# Patient Record
Sex: Male | Born: 1965 | Race: White | Hispanic: No | Marital: Married | Smoking: Former smoker
Health system: Southern US, Community
[De-identification: ages and names within clinical notes are randomized; demographics above are authoritative.]

## PROBLEM LIST (undated history)

## (undated) DIAGNOSIS — N400 Enlarged prostate without lower urinary tract symptoms: Secondary | ICD-10-CM

## (undated) DIAGNOSIS — I10 Essential (primary) hypertension: Secondary | ICD-10-CM

## (undated) DIAGNOSIS — E669 Obesity, unspecified: Secondary | ICD-10-CM

## (undated) HISTORY — PX: VASECTOMY: SHX75

---

## 2000-06-10 ENCOUNTER — Emergency Department (HOSPITAL_COMMUNITY): Admission: EM | Admit: 2000-06-10 | Discharge: 2000-06-10 | Payer: Self-pay | Admitting: Internal Medicine

## 2000-06-10 ENCOUNTER — Encounter: Payer: Self-pay | Admitting: Internal Medicine

## 2014-10-05 ENCOUNTER — Emergency Department (HOSPITAL_COMMUNITY): Payer: 59

## 2014-10-05 ENCOUNTER — Emergency Department (HOSPITAL_COMMUNITY)
Admission: EM | Admit: 2014-10-05 | Discharge: 2014-10-05 | Disposition: A | Discharge status: Home / Self Care | Payer: 59 | Attending: Emergency Medicine | Admitting: Emergency Medicine

## 2014-10-05 ENCOUNTER — Encounter (HOSPITAL_COMMUNITY): Payer: Self-pay | Admitting: *Deleted

## 2014-10-05 DIAGNOSIS — R112 Nausea with vomiting, unspecified: Secondary | ICD-10-CM

## 2014-10-05 DIAGNOSIS — R079 Chest pain, unspecified: Secondary | ICD-10-CM | POA: Diagnosis present

## 2014-10-05 DIAGNOSIS — E669 Obesity, unspecified: Secondary | ICD-10-CM | POA: Diagnosis not present

## 2014-10-05 DIAGNOSIS — R1013 Epigastric pain: Secondary | ICD-10-CM | POA: Diagnosis not present

## 2014-10-05 DIAGNOSIS — R1010 Upper abdominal pain, unspecified: Secondary | ICD-10-CM

## 2014-10-05 DIAGNOSIS — R197 Diarrhea, unspecified: Secondary | ICD-10-CM | POA: Diagnosis not present

## 2014-10-05 DIAGNOSIS — Z880 Allergy status to penicillin: Secondary | ICD-10-CM | POA: Insufficient documentation

## 2014-10-05 DIAGNOSIS — R1011 Right upper quadrant pain: Secondary | ICD-10-CM | POA: Insufficient documentation

## 2014-10-05 HISTORY — DX: Obesity, unspecified: E66.9

## 2014-10-05 LAB — CBC WITH DIFFERENTIAL/PLATELET
Basophils Absolute: 0 10*3/uL (ref 0.0–0.1)
Basophils Relative: 0 % (ref 0–1)
Eosinophils Absolute: 0.1 10*3/uL (ref 0.0–0.7)
Eosinophils Relative: 1 % (ref 0–5)
HCT: 46.7 % (ref 39.0–52.0)
Hemoglobin: 16.1 g/dL (ref 13.0–17.0)
Lymphocytes Relative: 9 % — ABNORMAL LOW (ref 12–46)
Lymphs Abs: 1.1 10*3/uL (ref 0.7–4.0)
MCH: 29.4 pg (ref 26.0–34.0)
MCHC: 34.5 g/dL (ref 30.0–36.0)
MCV: 85.2 fL (ref 78.0–100.0)
Monocytes Absolute: 0.6 10*3/uL (ref 0.1–1.0)
Monocytes Relative: 5 % (ref 3–12)
Neutro Abs: 10.8 10*3/uL — ABNORMAL HIGH (ref 1.7–7.7)
Neutrophils Relative %: 86 % — ABNORMAL HIGH (ref 43–77)
Platelets: 324 10*3/uL (ref 150–400)
RBC: 5.48 MIL/uL (ref 4.22–5.81)
RDW: 12.9 % (ref 11.5–15.5)
WBC: 12.6 10*3/uL — ABNORMAL HIGH (ref 4.0–10.5)

## 2014-10-05 LAB — COMPREHENSIVE METABOLIC PANEL
ALT: 20 U/L (ref 17–63)
AST: 19 U/L (ref 15–41)
Albumin: 3.9 g/dL (ref 3.5–5.0)
Alkaline Phosphatase: 73 U/L (ref 38–126)
Anion gap: 11 (ref 5–15)
BUN: 19 mg/dL (ref 6–20)
CO2: 24 mmol/L (ref 22–32)
Calcium: 8.7 mg/dL — ABNORMAL LOW (ref 8.9–10.3)
Chloride: 103 mmol/L (ref 101–111)
Creatinine, Ser: 1.21 mg/dL (ref 0.61–1.24)
GFR calc Af Amer: >60 mL/min (ref >60)
GFR calc non Af Amer: >60 mL/min (ref >60)
Glucose, Bld: 122 mg/dL — ABNORMAL HIGH (ref 65–99)
Potassium: 4.1 mmol/L (ref 3.5–5.1)
Sodium: 138 mmol/L (ref 135–145)
Total Bilirubin: 0.6 mg/dL (ref 0.3–1.2)
Total Protein: 7.9 g/dL (ref 6.5–8.1)

## 2014-10-05 LAB — I-STAT TROPONIN, ED
TROPONIN I, POC: 0 ng/mL (ref 0.00–0.08)
Troponin i, poc: 0.01 ng/mL (ref 0.00–0.08)

## 2014-10-05 LAB — LIPASE, BLOOD: Lipase: 19 U/L — ABNORMAL LOW (ref 22–51)

## 2014-10-05 MED ORDER — GI COCKTAIL ~~LOC~~
30.0000 mL | Freq: Once | ORAL | Status: AC
  Administered 2014-10-05: 30 mL via ORAL
  Filled 2014-10-05: qty 30

## 2014-10-05 MED ORDER — ONDANSETRON HCL 4 MG/2ML IJ SOLN
4.0000 mg | Freq: Once | INTRAMUSCULAR | Status: AC
  Administered 2014-10-05: 4 mg via INTRAVENOUS
  Filled 2014-10-05: qty 2

## 2014-10-05 MED ORDER — ONDANSETRON HCL 4 MG PO TABS: 4.0000 mg | ORAL_TABLET | Freq: Three times a day (TID) | ORAL | Status: DC | PRN

## 2014-10-05 MED ORDER — MORPHINE SULFATE 4 MG/ML IJ SOLN
4.0000 mg | Freq: Once | INTRAMUSCULAR | Status: AC
  Administered 2014-10-05: 4 mg via INTRAVENOUS
  Filled 2014-10-05: qty 1

## 2014-10-05 MED ORDER — SODIUM CHLORIDE 0.9 % IV BOLUS (SEPSIS)
1000.0000 mL | Freq: Once | INTRAVENOUS | Status: AC
  Administered 2014-10-05: 1000 mL via INTRAVENOUS

## 2014-10-05 NOTE — ED Notes (Signed)
Pt reports feeling like he has heartburn this am, having n/v and diarrhea. Reports intermittent mid chest pains and left arm numbness. Diaphoretic at triage.

## 2014-10-05 NOTE — ED Notes (Signed)
PT ambulated with baseline gait; VSS; A&Ox3; no signs of distress; respirations even and unlabored; skin warm and dry; no questions upon discharge.  

## 2014-10-05 NOTE — Discharge Instructions (Signed)
Please follow-up with your primary care provider for further care. Take Zofran as needed for nausea. Drink fluid is dehydrated. Return to the ED if your condition worsen or if you have any other concern. You will benefit from further evaluation of your heart through a cardiologist.  Please call [facility] to set up an appointment.

## 2014-10-05 NOTE — ED Notes (Signed)
MD at bedside. 

## 2014-10-05 NOTE — ED Provider Notes (Signed)
CSN: [id number]     Arrival date & time 10/05/14  0451 History   First MD Initiated Contact with Patient 10/05/14 0510     Chief Complaint  Patient presents with  . Chest Pain  . Diarrhea     (Consider location/radiation/quality/duration/timing/severity/associated sxs/prior Treatment) HPI  49 year old obese male presenting for evaluation of upper abdominal pain. Patient reportedly developed gradual onset of pain to his epigastric region at 1 AM last night. Pain is crampy, epigastric, associated with nausea, nonbloody nonbilious vomit, and having loose nonbloody non-mucousy stools. Pain subsequently spread to his chest. At one point he was noticing L arm numbness which lasted briefly and resolved without any specific treatment. Arm numbness recurred 5 minutes later and lasting for a few seconds. He denies laying on his arm, no associated headache or neck pain. He subsequently developing sharp pain to his left lateral chest lasting briefly. Pain felt different from heartburn. He reported worsening crampy abdominal pain and became diaphoretic. Subsequently went to the ER for further evaluation. He admits to eating cheeseburger and ice cream yesterday. He denies fever, chills, productive cough, lightheadedness, dizziness, back pain, dysuria, rash. He is a nonsmoker. Does have a strong family history of cardiac disease. No history of diabetes, alcohol abuse, or street drug use. Patient also reported having exertional chest discomfort for the past several months accompanied with shortness of breath. No prior provocative cardiac testing.  Past Medical History  Diagnosis Date  . Obesity    History reviewed. No pertinent past surgical history. History reviewed. No pertinent family history. History  Substance Use Topics  . Smoking status: Not on file  . Smokeless tobacco: Not on file  . Alcohol Use: No    Review of Systems  All other systems reviewed and are negative.     Allergies   Penicillins  Home Medications   Prior to Admission medications   Not on File   BP 124/91 mmHg  Pulse 115  Temp(Src) 98.7 F (37.1 C) (Oral)  Resp 20  Ht 5\' 6"  (1.676 m)  Wt 269 lb 3.2 oz (122.108 kg)  BMI 43.47 kg/m2  SpO2 95% Physical Exam  Constitutional: He is oriented to person, place, and time. He appears well-developed and well-nourished. No distress.  Obese Caucasian male appears to be in no acute discomfort.  HENT:  Head: Atraumatic.  Eyes: Conjunctivae are normal.  Neck: Neck supple.  Cardiovascular: Normal rate, regular rhythm and intact distal pulses.  Exam reveals no gallop and no friction rub.   No murmur heard. Pulmonary/Chest: Effort normal and breath sounds normal. No respiratory distress. He has no wheezes. He has no rales. He exhibits no tenderness.  Abdominal: Soft. Bowel sounds are normal. He exhibits no distension. There is tenderness (Tenderness to epigastric and right upper quadrant on palpation without guarding rebound tenderness. Positive Murphy sign. No pain at McBurney's point.).  Musculoskeletal: He exhibits no edema.  Neurological: He is alert and oriented to person, place, and time.  5/5 strength to BUE.  Intact radial pulses, normal grip strength, sensation intact throughout.   Skin: No rash noted.  Psychiatric: He has a normal mood and affect.  Nursing note and vitals reviewed.   ED Course  Procedures (including critical care time)  Patient here with nausea vomiting diarrhea along with upper abdominal pain. Symptoms concerning for biliary etiology. I will obtain abdominal ultrasound for evaluation. Patient also complaining of chest pain, shortness of breath and also concern of cardiac disease.  His HEART score is 2.  8:53 AM Abdominal ultrasound shows no acute finding to suggest gallbladder etiology. Chest x-ray is unremarkable. EKG initially shows sinus tach however his heart rate has since normalized. No hypoxia.  10:37 AM Patient here  with epigastric abdominal pain and chest pain. He also endorsed intermittent left arm tingling sensation which has since resolved. No lightheadedness or dizziness but did report that he became diaphoretic. His pain is currently controlled after receiving 4 mg of morphine. He has no active chest pain. Mild elevated WBC of 12.6. Chest x-ray without widening mediastinum or acute finding.  Abdominal ultrasound shows no acute finding and no evidence of AAA.  11:54 AM Patient is currently symptom free, able to tolerates by mouth. Dr. Criss Alvine asked to evaluate patient and felt that he is stable for discharge. Return precautions discussed. Cardiology referral given as needed.  Labs Review Labs Reviewed  CBC WITH DIFFERENTIAL/PLATELET - Abnormal; Notable for the following:    WBC 12.6 (*)    Neutrophils Relative % 86 (*)    Neutro Abs 10.8 (*)    Lymphocytes Relative 9 (*)    All other components within normal limits  COMPREHENSIVE METABOLIC PANEL - Abnormal; Notable for the following:    Glucose, Bld 122 (*)    Calcium 8.7 (*)    All other components within normal limits  LIPASE, BLOOD - Abnormal; Notable for the following:    Lipase 19 (*)    All other components within normal limits  I-STAT TROPOININ, ED    Imaging Review US Abdomen Complete  10/05/2014   CLINICAL DATA:  Abdominal pain, nausea, vomiting and chest pain.  EXAM: ULTRASOUND ABDOMEN COMPLETE  COMPARISON:  None.  FINDINGS: Gallbladder: No gallstones or wall thickening visualized. No sonographic Murphy sign noted.  Common bile duct: Diameter: Normal caliber of 3 mm.  Liver: No focal lesion identified. Within normal limits in parenchymal echogenicity.  IVC: No abnormality visualized.  Pancreas: Visualized portion unremarkable.  Spleen: Size and appearance within normal limits.  Right Kidney: Length: 12.4 cm. Echogenicity within normal limits. No mass or hydronephrosis visualized.  Left Kidney: Length: 12.8 cm. Echogenicity within normal  limits. No mass or hydronephrosis visualized.  Abdominal aorta: No aneurysm visualized.  Other findings: None.  IMPRESSION: Normal abdominal ultrasound.   Electronically Signed   By: Irish Lack M.D.   On: 10/05/2014 07:57   Dg Chest Portable 1 View  10/05/2014   CLINICAL DATA:  Chest pain  EXAM: PORTABLE CHEST - 1 VIEW  COMPARISON:  05/30/2014  FINDINGS: A single AP portable view of the chest demonstrates no focal airspace consolidation or alveolar edema. The lungs are grossly clear. There is no large effusion or pneumothorax. Cardiac and mediastinal contours appear unremarkable.  IMPRESSION: No active disease.   Electronically Signed   By: Ellery Plunk M.D.   On: 10/05/2014 06:15     EKG Interpretation   Date/Time:  Wednesday October 05 2014 04:59:49 EDT Ventricular Rate:  113 PR Interval:  136 QRS Duration: 68 QT Interval:  310 QTC Calculation: 425 R Axis:   79 Text Interpretation:  Sinus tachycardia Otherwise normal ECG No prior for  comparison Confirmed by HORTON  MD, COURTNEY ([id number]) on 10/05/2014 5:05:22  AM      MDM   Final diagnoses:  Upper abdominal pain  Nausea vomiting and diarrhea    BP 121/81 mmHg  Pulse 97  Temp(Src) 98.7 F (37.1 C) (Oral)  Resp 16  Ht  (1.676 m)  Wt 269 lb 3.2 oz (  122.108 kg)  BMI 43.47 kg/m2  SpO2 92%  I have reviewed nursing notes and vital signs. I personally reviewed the imaging tests through PACS system  I reviewed available ER/hospitalization records thought the EMR     Fayrene Helper, PA-C 10/05/14 1155  Shon Baton, MD 10/06/14 [phone]

## 2014-10-24 DIAGNOSIS — Z8249 Family history of ischemic heart disease and other diseases of the circulatory system: Secondary | ICD-10-CM | POA: Insufficient documentation

## 2014-10-24 DIAGNOSIS — R079 Chest pain, unspecified: Secondary | ICD-10-CM | POA: Insufficient documentation

## 2014-10-24 DIAGNOSIS — J45909 Unspecified asthma, uncomplicated: Secondary | ICD-10-CM | POA: Insufficient documentation

## 2014-10-24 DIAGNOSIS — R0602 Shortness of breath: Secondary | ICD-10-CM | POA: Insufficient documentation

## 2017-12-17 DIAGNOSIS — M5136 Other intervertebral disc degeneration, lumbar region: Secondary | ICD-10-CM | POA: Insufficient documentation

## 2017-12-17 DIAGNOSIS — M542 Cervicalgia: Secondary | ICD-10-CM | POA: Insufficient documentation

## 2018-02-06 ENCOUNTER — Ambulatory Visit: Payer: 59 | Admitting: Podiatry

## 2018-02-06 ENCOUNTER — Encounter: Payer: Self-pay | Admitting: Podiatry

## 2018-02-06 ENCOUNTER — Ambulatory Visit (INDEPENDENT_AMBULATORY_CARE_PROVIDER_SITE_OTHER): Payer: 59

## 2018-02-06 VITALS — BP 150/84 | HR 87

## 2018-02-06 DIAGNOSIS — S90851A Superficial foreign body, right foot, initial encounter: Secondary | ICD-10-CM | POA: Diagnosis not present

## 2018-02-06 DIAGNOSIS — M795 Residual foreign body in soft tissue: Secondary | ICD-10-CM | POA: Diagnosis not present

## 2018-02-06 DIAGNOSIS — L089 Local infection of the skin and subcutaneous tissue, unspecified: Secondary | ICD-10-CM

## 2018-02-06 DIAGNOSIS — M79671 Pain in right foot: Secondary | ICD-10-CM

## 2018-02-06 MED ORDER — HYDROCODONE-ACETAMINOPHEN 5-325 MG PO TABS: 1.0000 | ORAL_TABLET | Freq: Four times a day (QID) | ORAL | 0 refills | Status: DC | PRN

## 2018-02-06 MED ORDER — CIPROFLOXACIN HCL 250 MG PO TABS: 250.0000 mg | ORAL_TABLET | Freq: Two times a day (BID) | ORAL | 0 refills | Status: AC

## 2018-02-06 MED ORDER — KETOCONAZOLE 2 % EX CREA: TOPICAL_CREAM | CUTANEOUS | 0 refills | Status: AC

## 2018-02-09 ENCOUNTER — Encounter: Payer: Self-pay | Admitting: Podiatry

## 2018-02-09 ENCOUNTER — Ambulatory Visit (INDEPENDENT_AMBULATORY_CARE_PROVIDER_SITE_OTHER): Payer: 59 | Admitting: Podiatry

## 2018-02-09 VITALS — Temp 98.4°F

## 2018-02-09 DIAGNOSIS — L089 Local infection of the skin and subcutaneous tissue, unspecified: Secondary | ICD-10-CM

## 2018-02-09 DIAGNOSIS — M79604 Pain in right leg: Secondary | ICD-10-CM

## 2018-02-09 DIAGNOSIS — L03119 Cellulitis of unspecified part of limb: Secondary | ICD-10-CM | POA: Diagnosis not present

## 2018-02-09 DIAGNOSIS — S90851A Superficial foreign body, right foot, initial encounter: Secondary | ICD-10-CM

## 2018-02-09 DIAGNOSIS — L02619 Cutaneous abscess of unspecified foot: Secondary | ICD-10-CM

## 2018-02-09 MED ORDER — CIPROFLOXACIN HCL 500 MG PO TABS: 500.0000 mg | ORAL_TABLET | Freq: Two times a day (BID) | ORAL | 0 refills | Status: AC

## 2018-02-09 MED ORDER — OXYCODONE-ACETAMINOPHEN 10-325 MG PO TABS: 1.0000 | ORAL_TABLET | ORAL | 0 refills | Status: AC | PRN

## 2018-02-09 MED ORDER — CLINDAMYCIN HCL 300 MG PO CAPS: 300.0000 mg | ORAL_CAPSULE | Freq: Three times a day (TID) | ORAL | 1 refills | Status: AC

## 2018-02-10 NOTE — Progress Notes (Signed)
Subjective:   Patient ID: Curtis Lamb, male   DOB: 52 y.o.   MRN: [id number]   HPI patient presents stating that 3 days early.  Had a small fishbone removed in the bottom of his right foot by Dr. Samuella Cota and that it started to bother him more over the last day and is having trouble bearing weight down on his foot.  Patient has not had any other issues and thought maybe he had a low-grade fever the night before but not sure and today when his temperature was checked it was 98.4   ROS      Objective:  Physical Exam  Neurovascular status intact with patient found to have irritation around the plantar lateral aspect of the right foot where a previous small incision was made.  It is localized and I did not note any proximal edema erythema or no active drainage at the current time with mild soreness noted.  Patient had negative Homans sign and no systemic indications of infection     Assessment:  Possibility that this is an inflammatory reaction versus possibility for localized infective reaction even though there is no current drainage or indication of abscess     Plan:  H&P condition reviewed at great length and as precautionary measure I did place the patient on Cleocin and up to the Cipro dosage to 500 mg twice daily.  I instructed on soaks and I dispensed an air fracture walker to try to reduce the stress on his foot and I gave strict instructions of any redness should occur any systemic signs of infection or any active drainage were to occur he is to go straight to the emergency room.  I did discuss that this may need to be opened up and that it is possible it will require hospitalization and I am going to have him see Dr. Samuella Cota on Thursday and hope that he will respond to the antibiotic by then but he is given strict instructions to call us if any changes were to occur.

## 2018-02-12 ENCOUNTER — Encounter: Payer: Self-pay | Admitting: Podiatry

## 2018-02-12 ENCOUNTER — Ambulatory Visit: Payer: Self-pay | Admitting: Podiatry

## 2018-02-12 ENCOUNTER — Telehealth: Payer: Self-pay | Admitting: *Deleted

## 2018-02-12 DIAGNOSIS — M79604 Pain in right leg: Secondary | ICD-10-CM

## 2018-02-12 DIAGNOSIS — M79671 Pain in right foot: Secondary | ICD-10-CM

## 2018-02-12 DIAGNOSIS — L03119 Cellulitis of unspecified part of limb: Secondary | ICD-10-CM

## 2018-02-12 DIAGNOSIS — L02619 Cutaneous abscess of unspecified foot: Secondary | ICD-10-CM

## 2018-02-12 DIAGNOSIS — S90851D Superficial foreign body, right foot, subsequent encounter: Secondary | ICD-10-CM

## 2018-02-12 DIAGNOSIS — L089 Local infection of the skin and subcutaneous tissue, unspecified: Secondary | ICD-10-CM

## 2018-02-12 DIAGNOSIS — S90851A Superficial foreign body, right foot, initial encounter: Principal | ICD-10-CM

## 2018-02-12 NOTE — Telephone Encounter (Signed)
Dr. Samuella Cota ordered US right foot STAT for FB with Abscess. Leeanne Deed Main Scheduling-Radiology scheduled pt for 02/13/2018 arrive 9:15am for 9:30am [place] at [facility]. Faxed orders to [facility] Scheduling.

## 2018-02-12 NOTE — Telephone Encounter (Signed)
I informed pt of the [place] Appt tomorrow at [facility].

## 2018-02-13 ENCOUNTER — Ambulatory Visit (HOSPITAL_COMMUNITY)
Admission: RE | Admit: 2018-02-13 | Discharge: 2018-02-13 | Disposition: A | Discharge status: Home / Self Care | Payer: 59 | Source: Ambulatory Visit | Attending: Podiatry | Admitting: Podiatry

## 2018-02-13 ENCOUNTER — Telehealth: Payer: Self-pay | Admitting: Podiatry

## 2018-02-13 DIAGNOSIS — L02619 Cutaneous abscess of unspecified foot: Secondary | ICD-10-CM | POA: Diagnosis present

## 2018-02-13 DIAGNOSIS — S90851A Superficial foreign body, right foot, initial encounter: Secondary | ICD-10-CM | POA: Insufficient documentation

## 2018-02-13 DIAGNOSIS — L03119 Cellulitis of unspecified part of limb: Secondary | ICD-10-CM | POA: Diagnosis present

## 2018-02-13 DIAGNOSIS — M79604 Pain in right leg: Secondary | ICD-10-CM | POA: Insufficient documentation

## 2018-02-13 DIAGNOSIS — X58XXXA Exposure to other specified factors, initial encounter: Secondary | ICD-10-CM | POA: Diagnosis not present

## 2018-02-13 DIAGNOSIS — L089 Local infection of the skin and subcutaneous tissue, unspecified: Secondary | ICD-10-CM | POA: Diagnosis not present

## 2018-02-13 NOTE — Telephone Encounter (Signed)
Attempted to call patient to discuss results. No answer. VM left. Advised patient to call back and we will discuss results.

## 2018-02-16 ENCOUNTER — Telehealth: Payer: Self-pay | Admitting: *Deleted

## 2018-02-16 ENCOUNTER — Telehealth: Payer: Self-pay | Admitting: Podiatry

## 2018-02-16 NOTE — Telephone Encounter (Signed)
Pt. Returned call to discuss results

## 2018-02-16 NOTE — Telephone Encounter (Signed)
Dr. Samuella Cota reviewed the emailed photo.From: [name]> Sent: Monday, February 16, 2018 2:22 PM To: Alphia [email or web address]> >

## 2018-02-16 NOTE — Telephone Encounter (Signed)
Dr. Samuella Cota states the foot doesn't look bad, keep clean, apply antibiotic ointment and a bandaid. I called orders to pt and he states that is what he is doing, and asked for his appt time 02/20/2018. I told pt the appt Friday was at 3:15pm.

## 2018-02-16 NOTE — Telephone Encounter (Signed)
Dr. Samuella Cota spoke with pt and explained [place] results and treatment options. Pt states he would like to send a picture of the foot current state. I received the emailed photo of pt's foot and forwarded to Dr. Samuella Cota.

## 2018-02-17 NOTE — Telephone Encounter (Signed)
Called and discusses results with patient yesterday. Will f/u in the office this week.

## 2018-02-20 ENCOUNTER — Ambulatory Visit: Payer: 59 | Admitting: Podiatry

## 2018-02-20 ENCOUNTER — Encounter: Payer: Self-pay | Admitting: Podiatry

## 2018-02-20 DIAGNOSIS — M79604 Pain in right leg: Secondary | ICD-10-CM

## 2018-02-20 DIAGNOSIS — S90851D Superficial foreign body, right foot, subsequent encounter: Secondary | ICD-10-CM | POA: Diagnosis not present

## 2018-02-20 DIAGNOSIS — L03119 Cellulitis of unspecified part of limb: Secondary | ICD-10-CM | POA: Diagnosis not present

## 2018-02-20 DIAGNOSIS — L089 Local infection of the skin and subcutaneous tissue, unspecified: Secondary | ICD-10-CM

## 2018-02-20 DIAGNOSIS — M795 Residual foreign body in soft tissue: Secondary | ICD-10-CM | POA: Diagnosis not present

## 2018-02-20 DIAGNOSIS — L02619 Cutaneous abscess of unspecified foot: Secondary | ICD-10-CM

## 2018-02-20 MED ORDER — HYDROCODONE-ACETAMINOPHEN 5-325 MG PO TABS: 1.0000 | ORAL_TABLET | Freq: Four times a day (QID) | ORAL | 0 refills | Status: DC | PRN

## 2018-02-23 ENCOUNTER — Telehealth: Payer: Self-pay | Admitting: *Deleted

## 2018-02-23 NOTE — Telephone Encounter (Signed)
Pt presents to office states he needs a note explaining the light duty. I told pt did not have that in the last Clinicals, but would write Dr. Samuella Cota and have the note ready as soon as possible tomorrow. Pt states he has been out of work 2 weeks and needs to get back. 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

## 2018-02-24 ENCOUNTER — Encounter: Payer: Self-pay | Admitting: *Deleted

## 2018-02-24 NOTE — Progress Notes (Signed)
  Subjective:  Patient ID: Curtis Lamb, male    DOB: 06-08-1965,  MRN: [id number]  Chief Complaint  Patient presents with  . Wound Check    Follow up on splinter in right foot, lateral side. Pt states healing well with no new complaints. Some redness at wound.   52 y.o. male presents with the above complaint. Thinks that some piece of glass came out of the wound over the weekend. Finally starting to feel better.  Review of Systems: Negative except as noted in the HPI. Denies N/V/F/Ch.  Past Medical History:  Diagnosis Date  . Obesity     Current Outpatient Medications:  .  ciprofloxacin (CIPRO) 250 MG tablet, Take 1 tablet (250 mg total) by mouth 2 (two) times daily., Disp: 6 tablet, Rfl: 0 .  ciprofloxacin (CIPRO) 500 MG tablet, Take 1 tablet (500 mg total) by mouth 2 (two) times daily., Disp: 20 tablet, Rfl: 0 .  clindamycin (CLEOCIN) 300 MG capsule, Take 1 capsule (300 mg total) by mouth 3 (three) times daily., Disp: 30 capsule, Rfl: 1 .  HYDROcodone-acetaminophen (NORCO) 5-325 MG tablet, Take 1 tablet by mouth every 6 (six) hours as needed for moderate pain., Disp: 12 tablet, Rfl: 0 .  ketoconazole (NIZORAL) 2 % cream, Apply 1 fingertip amount to each foot daily., Disp: 30 g, Rfl: 0 .  ondansetron (ZOFRAN) 4 MG tablet, Take 1 tablet (4 mg total) by mouth every 8 (eight) hours as needed for nausea or vomiting., Disp: 12 tablet, Rfl: 0 .  oxyCODONE-acetaminophen (PERCOCET) 10-325 MG tablet, Take 1 tablet by mouth every 4 (four) hours as needed for pain., Disp: 30 tablet, Rfl: 0  Social History   Tobacco Use  Smoking Status Never Smoker  Smokeless Tobacco Never Used    Allergies  Allergen Reactions  . Penicillins    Objective:   There were no vitals filed for this visit. There is no height or weight on file to calculate BMI. Constitutional Well developed. Well nourished.  Vascular Dorsalis pedis pulses palpable bilaterally. Posterior tibial pulses palpable  bilaterally. Capillary refill normal to all digits.  No cyanosis or clubbing noted. Pedal hair growth normal.  Neurologic Normal speech. Oriented to person, place, and time. Epicritic sensation to light touch grossly present bilaterally.  Dermatologic Nails well groomed and normal in appearance. No open wounds.  Puncture wound present of the right foot healing well with only slight periwound erythema.  Orthopedic: Normal joint ROM without pain or crepitus bilaterally. No visible deformities. No bony tenderness.   Radiographs: none today Assessment:   1. Foreign body in right foot with infection, subsequent encounter   2. Cellulitis and abscess of foot, except toes   3. Pain of right lower extremity   4. Residual foreign body in soft tissue    Plan:  Patient was evaluated and treated and all questions answered.  Splinter Right Foot -[place] reviewed no evidence of retained foreign body. Small fluid collection. -Transition out of boot at patient's comfort. -Should pain persist would consider surgical I&D for small residual fluid collection.  -Ok to go back to work next week  Return in about 2 weeks (around 03/06/2018) for foreign body f/u.

## 2018-02-24 NOTE — Telephone Encounter (Signed)
I informed pt of Dr. Kandice Hams orders for light duty as no standing more than 4 hours per day.

## 2018-02-24 NOTE — Progress Notes (Signed)
Subjective:  Patient ID: Curtis Lamb, male    DOB: [birth year],  MRN: [id number]  Chief Complaint  Patient presents with  . Foot Pain    (NP) splinter in right foot/oked per Dr. Samuella Cota    52 y.o. male presents with the above complaint.  Reports a splinter to the right foot.  Has had pain for about a week.  States there is very painful to walk on.   Review of Systems: Negative except as noted in the HPI. Denies N/V/F/Ch.  Past Medical History:  Diagnosis Date  . Obesity     Current Outpatient Medications:  .  ciprofloxacin (CIPRO) 250 MG tablet, Take 1 tablet (250 mg total) by mouth 2 (two) times daily., Disp: 6 tablet, Rfl: 0 .  ciprofloxacin (CIPRO) 500 MG tablet, Take 1 tablet (500 mg total) by mouth 2 (two) times daily., Disp: 20 tablet, Rfl: 0 .  clindamycin (CLEOCIN) 300 MG capsule, Take 1 capsule (300 mg total) by mouth 3 (three) times daily., Disp: 30 capsule, Rfl: 1 .  HYDROcodone-acetaminophen (NORCO) 5-325 MG tablet, Take 1 tablet by mouth every 6 (six) hours as needed for moderate pain., Disp: 12 tablet, Rfl: 0 .  ketoconazole (NIZORAL) 2 % cream, Apply 1 fingertip amount to each foot daily., Disp: 30 g, Rfl: 0 .  ondansetron (ZOFRAN) 4 MG tablet, Take 1 tablet (4 mg total) by mouth every 8 (eight) hours as needed for nausea or vomiting., Disp: 12 tablet, Rfl: 0 .  oxyCODONE-acetaminophen (PERCOCET) 10-325 MG tablet, Take 1 tablet by mouth every 4 (four) hours as needed for pain., Disp: 30 tablet, Rfl: 0  Social History   Tobacco Use  Smoking Status Never Smoker  Smokeless Tobacco Never Used    Allergies  Allergen Reactions  . Penicillins    Objective:   Vitals:   02/06/18 1329  BP: (!) 150/84  Pulse: 87   There is no height or weight on file to calculate BMI. Constitutional Well developed. Well nourished.  Vascular Dorsalis pedis pulses palpable bilaterally. Posterior tibial pulses palpable bilaterally. Capillary refill normal to all digits.  No  cyanosis or clubbing noted. Pedal hair growth normal.  Neurologic Normal speech. Oriented to person, place, and time. Epicritic sensation to light touch grossly present bilaterally.  Dermatologic Nails well groomed and normal in appearance. No open wounds.  Puncture wound present of the right foot with pain palpation about the wound  Orthopedic: Normal joint ROM without pain or crepitus bilaterally. No visible deformities. No bony tenderness.   Radiographs: Taken and reviewed no underlying foreign body evident on x-ray Assessment:   1. Foreign body in right foot with infection, initial encounter   2. Pain in right foot   3. Residual foreign body in soft tissue    Plan:  Patient was evaluated and treated and all questions answered.  Splinter Right Foot -X-rays taken and reviewed as above -Foreign body removed as below -CAM boot dispensed -Follow-up in 2 weeks for recheck -Advised to follow-up with PCP for possible tetanus covered -Rx Cipro for antibiotic prophylaxis and Norco for pain.  Procedure: Removal of foreign body Anesthesia: Lidocaine with epi 2 ml Instrumentation: 312 blade Technique: Following sterile skin prep, the area was debrided with a 312 blade. A shard of what appears to be glass was removed from the wound. Dressing: Dry, sterile, compression dressing. Disposition: Patient tolerated procedure well. Patient to return in 1 week for follow-up.   Return in about 2 weeks (around 02/20/2018) for splinter f/u.

## 2018-02-24 NOTE — Progress Notes (Signed)
  Subjective:  Patient ID: Curtis Lamb, male    DOB: 06/10/1965,  MRN: [id number]  Chief Complaint  Patient presents with  . Foot Injury    right foot bottom lateral; pt stated, "pain is extreme today, feels like a blister but no fluid coming out"   52 y.o. male presents with the above complaint. Still having a lot of pain.  Review of Systems: Negative except as noted in the HPI. Denies N/V/F/Ch.  Past Medical History:  Diagnosis Date  . Obesity     Current Outpatient Medications:  .  ciprofloxacin (CIPRO) 250 MG tablet, Take 1 tablet (250 mg total) by mouth 2 (two) times daily., Disp: 6 tablet, Rfl: 0 .  ciprofloxacin (CIPRO) 500 MG tablet, Take 1 tablet (500 mg total) by mouth 2 (two) times daily., Disp: 20 tablet, Rfl: 0 .  clindamycin (CLEOCIN) 300 MG capsule, Take 1 capsule (300 mg total) by mouth 3 (three) times daily., Disp: 30 capsule, Rfl: 1 .  ketoconazole (NIZORAL) 2 % cream, Apply 1 fingertip amount to each foot daily., Disp: 30 g, Rfl: 0 .  ondansetron (ZOFRAN) 4 MG tablet, Take 1 tablet (4 mg total) by mouth every 8 (eight) hours as needed for nausea or vomiting., Disp: 12 tablet, Rfl: 0 .  oxyCODONE-acetaminophen (PERCOCET) 10-325 MG tablet, Take 1 tablet by mouth every 4 (four) hours as needed for pain., Disp: 30 tablet, Rfl: 0 .  HYDROcodone-acetaminophen (NORCO) 5-325 MG tablet, Take 1 tablet by mouth every 6 (six) hours as needed for moderate pain., Disp: 12 tablet, Rfl: 0  Social History   Tobacco Use  Smoking Status Never Smoker  Smokeless Tobacco Never Used    Allergies  Allergen Reactions  . Penicillins    Objective:   There were no vitals filed for this visit. There is no height or weight on file to calculate BMI. Constitutional Well developed. Well nourished.  Vascular Dorsalis pedis pulses palpable bilaterally. Posterior tibial pulses palpable bilaterally. Capillary refill normal to all digits.  No cyanosis or clubbing noted. Pedal hair  growth normal.  Neurologic Normal speech. Oriented to person, place, and time. Epicritic sensation to light touch grossly present bilaterally.  Dermatologic Nails well groomed and normal in appearance. No open wounds.  Puncture wound present of the right foot with pain palpation about the wound   Orthopedic: Normal joint ROM without pain or crepitus bilaterally. No visible deformities. No bony tenderness.   Radiographs: none today Assessment:   1. Foreign body in right foot with infection, subsequent encounter   2. Cellulitis and abscess of foot, except toes   3. Pain of right lower extremity   4. Pain in right foot    Plan:  Patient was evaluated and treated and all questions answered.  Splinter Right Foot -Healing with residual pain.  -No evident foreign body. -WBAT in CAM boot.. -Order [place] to r/o residual soft tissue foreign body.  Return in about 1 week (around 02/19/2018) for foreign body f/u.

## 2018-02-24 NOTE — Telephone Encounter (Signed)
DoneSherlynn Stalls duty no standing more than 4 hours.

## 2018-03-10 ENCOUNTER — Encounter: Payer: Self-pay | Admitting: Podiatry

## 2018-03-10 NOTE — Progress Notes (Signed)
Medical records were mailed to patient.

## 2018-07-03 DIAGNOSIS — E538 Deficiency of other specified B group vitamins: Secondary | ICD-10-CM | POA: Insufficient documentation

## 2018-07-03 DIAGNOSIS — E559 Vitamin D deficiency, unspecified: Secondary | ICD-10-CM | POA: Insufficient documentation

## 2018-07-27 ENCOUNTER — Emergency Department
Admission: EM | Admit: 2018-07-27 | Discharge: 2018-07-27 | Disposition: A | Discharge status: Home / Self Care | Payer: BLUE CROSS/BLUE SHIELD | Attending: Emergency Medicine | Admitting: Emergency Medicine

## 2018-07-27 ENCOUNTER — Other Ambulatory Visit: Payer: Self-pay

## 2018-07-27 ENCOUNTER — Encounter: Payer: Self-pay | Admitting: Emergency Medicine

## 2018-07-27 ENCOUNTER — Emergency Department: Payer: BLUE CROSS/BLUE SHIELD

## 2018-07-27 DIAGNOSIS — R509 Fever, unspecified: Secondary | ICD-10-CM

## 2018-07-27 DIAGNOSIS — Z87891 Personal history of nicotine dependence: Secondary | ICD-10-CM | POA: Diagnosis not present

## 2018-07-27 DIAGNOSIS — B9729 Other coronavirus as the cause of diseases classified elsewhere: Secondary | ICD-10-CM | POA: Insufficient documentation

## 2018-07-27 DIAGNOSIS — I10 Essential (primary) hypertension: Secondary | ICD-10-CM | POA: Diagnosis not present

## 2018-07-27 HISTORY — DX: Essential (primary) hypertension: I10

## 2018-07-27 HISTORY — DX: Benign prostatic hyperplasia without lower urinary tract symptoms: N40.0

## 2018-07-27 LAB — BASIC METABOLIC PANEL
Anion gap: 10 (ref 5–15)
BUN: 18 mg/dL (ref 6–20)
CO2: 24 mmol/L (ref 22–32)
Calcium: 8.6 mg/dL — ABNORMAL LOW (ref 8.9–10.3)
Chloride: 99 mmol/L (ref 98–111)
Creatinine, Ser: 1.31 mg/dL — ABNORMAL HIGH (ref 0.61–1.24)
GFR calc Af Amer: >60 mL/min (ref >60)
GFR calc non Af Amer: >60 mL/min (ref >60)
GLUCOSE: 121 mg/dL — AB (ref 70–99)
Potassium: 4 mmol/L (ref 3.5–5.1)
Sodium: 133 mmol/L — ABNORMAL LOW (ref 135–145)

## 2018-07-27 LAB — CBC
HCT: 43.4 % (ref 39.0–52.0)
Hemoglobin: 14.6 g/dL (ref 13.0–17.0)
MCH: 28.7 pg (ref 26.0–34.0)
MCHC: 33.6 g/dL (ref 30.0–36.0)
MCV: 85.3 fL (ref 80.0–100.0)
Platelets: 136 10*3/uL — ABNORMAL LOW (ref 150–400)
RBC: 5.09 MIL/uL (ref 4.22–5.81)
RDW: 13 % (ref 11.5–15.5)
WBC: 4.9 10*3/uL (ref 4.0–10.5)
nRBC: 0 % (ref 0.0–0.2)

## 2018-07-27 NOTE — Discharge Instructions (Addendum)
Return to the ER for new, worsening, or persistent severe shortness of breath, high fevers, weakness, or any other new or worsening symptoms that concern you.       Person Under Monitoring Name: Kenneth Briggs  Location: [address] [place] [address]   Infection Prevention Recommendations for Individuals Confirmed to have, or Being Evaluated for, 2019 Novel Coronavirus (COVID-19) Infection Who Receive Care at Home  Individuals who are confirmed to have, or are being evaluated for, COVID-19 should follow the prevention steps below until a healthcare provider or local or state health department says they can return to normal activities.  Stay home except to get medical care You should restrict activities outside your home, except for getting medical care. Do not go to work, school, or public areas, and do not use public transportation or taxis.  Call ahead before visiting your doctor Before your medical appointment, call the healthcare provider and tell them that you have, or are being evaluated for, COVID-19 infection. This will help the healthcare providers office take steps to keep other people from getting infected. Ask your healthcare provider to call the local or state health department.  Monitor your symptoms Seek prompt medical attention if your illness is worsening (e.g., difficulty breathing). Before going to your medical appointment, call the healthcare provider and tell them that you have, or are being evaluated for, COVID-19 infection. Ask your healthcare provider to call the local or state health department.  Wear a facemask You should wear a facemask that covers your nose and mouth when you are in the same room with other people and when you visit a healthcare provider. People who live with or visit you should also wear a facemask while they are in the same room with you.  Separate yourself from other people in your home As much as possible, you should stay  in a different room from other people in your home. Also, you should use a separate bathroom, if available.  Avoid sharing household items You should not share dishes, drinking glasses, cups, eating utensils, towels, bedding, or other items with other people in your home. After using these items, you should wash them thoroughly with soap and water.  Cover your coughs and sneezes Cover your mouth and nose with a tissue when you cough or sneeze, or you can cough or sneeze into your sleeve. Throw used tissues in a lined trash can, and immediately wash your hands with soap and water for at least 20 seconds or use an alcohol-based hand rub.  Wash your [employer] your hands often and thoroughly with soap and water for at least 20 seconds. You can use an alcohol-based hand sanitizer if soap and water are not available and if your hands are not visibly dirty. Avoid touching your eyes, nose, and mouth with unwashed hands.   Prevention Steps for Caregivers and Household Members of Individuals Confirmed to have, or Being Evaluated for, COVID-19 Infection Being Cared for in the Home  If you live with, or provide care at home for, a person confirmed to have, or being evaluated for, COVID-19 infection please follow these guidelines to prevent infection:  Follow healthcare providers instructions Make sure that you understand and can help the patient follow any healthcare provider instructions for all care.  Provide for the patients basic needs You should help the patient with basic needs in the home and provide support for getting groceries, prescriptions, and other personal needs.  Monitor the patients symptoms If they are  getting sicker, call his or her medical provider and tell them that the patient has, or is being evaluated for, COVID-19 infection. This will help the healthcare providers office take steps to keep other people from getting infected. Ask the healthcare provider to call the  local or state health department.  Limit the number of people who have contact with the patient If possible, have only one caregiver for the patient. Other household members should stay in another home or place of residence. If this is not possible, they should stay in another room, or be separated from the patient as much as possible. Use a separate bathroom, if available. Restrict visitors who do not have an essential need to be in the home.  Keep older adults, very young children, and other sick people away from the patient Keep older adults, very young children, and those who have compromised immune systems or chronic health conditions away from the patient. This includes people with chronic heart, lung, or kidney conditions, diabetes, and cancer.  Ensure good ventilation Make sure that shared spaces in the home have good air flow, such as from an air conditioner or an opened window, weather permitting.  Wash your hands often Wash your hands often and thoroughly with soap and water for at least 20 seconds. You can use an alcohol based hand sanitizer if soap and water are not available and if your hands are not visibly dirty. Avoid touching your eyes, nose, and mouth with unwashed hands. Use disposable paper towels to dry your hands. If not available, use dedicated cloth towels and replace them when they become wet.  Wear a facemask and gloves Wear a disposable facemask at all times in the room and gloves when you touch or have contact with the patients blood, body fluids, and/or secretions or excretions, such as sweat, saliva, sputum, nasal mucus, vomit, urine, or feces.  Ensure the mask fits over your nose and mouth tightly, and do not touch it during use. Throw out disposable facemasks and gloves after using them. Do not reuse. Wash your hands immediately after removing your facemask and gloves. If your personal clothing becomes contaminated, carefully remove clothing and launder. Wash  your hands after handling contaminated clothing. Place all used disposable facemasks, gloves, and other waste in a lined container before disposing them with other household waste. Remove gloves and wash your hands immediately after handling these items.  Do not share dishes, glasses, or other household items with the patient Avoid sharing household items. You should not share dishes, drinking glasses, cups, eating utensils, towels, bedding, or other items with a patient who is confirmed to have, or being evaluated for, COVID-19 infection. After the person uses these items, you should wash them thoroughly with soap and water.  Wash laundry thoroughly Immediately remove and wash clothes or bedding that have blood, body fluids, and/or secretions or excretions, such as sweat, saliva, sputum, nasal mucus, vomit, urine, or feces, on them. Wear gloves when handling laundry from the patient. Read and follow directions on labels of laundry or clothing items and detergent. In general, wash and dry with the warmest temperatures recommended on the label.  Clean all areas the individual has used often Clean all touchable surfaces, such as counters, tabletops, doorknobs, bathroom fixtures, toilets, phones, keyboards, tablets, and bedside tables, every day. Also, clean any surfaces that may have blood, body fluids, and/or secretions or excretions on them. Wear gloves when cleaning surfaces the patient has come in contact with. Use a diluted  bleach solution (e.g., dilute bleach with 1 part bleach and 10 parts water) or a household disinfectant with a label that says EPA-registered for coronaviruses. To make a bleach solution at home, add 1 tablespoon of bleach to 1 quart (4 cups) of water. For a larger supply, add  cup of bleach to 1 gallon (16 cups) of water. Read labels of cleaning products and follow recommendations provided on product labels. Labels contain instructions for safe and effective use of the  cleaning product including precautions you should take when applying the product, such as wearing gloves or eye protection and making sure you have good ventilation during use of the product. Remove gloves and wash hands immediately after cleaning.  Monitor yourself for signs and symptoms of illness Caregivers and household members are considered close contacts, should monitor their health, and will be asked to limit movement outside of the home to the extent possible. Follow the monitoring steps for close contacts listed on the symptom monitoring form.   ? If you have additional questions, contact your local health department or call the epidemiologist on call at [phone] (available 24/7). ? This guidance is subject to change. For the most up-to-date guidance from [facility], please refer to their website: [email or web address]

## 2018-07-27 NOTE — ED Triage Notes (Signed)
Pt states last Friday he was diagnosed with a "bad head cold," at via telehealth MD, cough worsened yesterday and was [facility] which was [facility], he called COVID hotline this am and they recommended he come to ED since his shob and fever increasing. They recommended he get CXR and COVID tested. Fever 103.8 this am. Took tylenol. NAD at this time.

## 2018-07-27 NOTE — ED Provider Notes (Signed)
[facility] Emergency Department Provider Note ____________________________________________   First MD Initiated Contact with Patient 07/27/18 1109     (approximate)  I have reviewed the triage vital signs and the nursing notes.   HISTORY  Chief Complaint Cough; Back Pain; Shortness of Breath; and Fever    HPI Kenneth Briggs is a 53 y.o. male with PMH as noted below who presents with fever, cough, and shortness of breath for the last 3 days, gradual onset, with fever as high as 103 at home and only intermittently relieved with Tylenol and ibuprofen.  The patient was tested for influenza at urgent care and was negative.  He called the COVID-19 hotline and was told to come into the emergency department to be tested.  Past Medical History:  Diagnosis Date  . Enlarged prostate   . Hypertension     There are no active problems to display for this patient.   Past Surgical History:  Procedure Laterality Date  . VASECTOMY      Prior to Admission medications   Not on File    Allergies Lisinopril  No family history on file.  Social History Social History   Tobacco Use  . Smoking status: Former Games developer  . Smokeless tobacco: Never Used  Substance Use Topics  . Alcohol use: Not Currently  . Drug use: Not on file    Review of Systems  Constitutional: Positive for fever Eyes: No redness. ENT: No sore throat. Cardiovascular: Denies chest pain. Respiratory: Positive for shortness of breath. Gastrointestinal: No vomiting.  Positive for diarrhea.  Genitourinary: Negative for flank pain.  Musculoskeletal: Negative for back pain. Skin: Negative for rash. Neurological: Negative for headache.   ____________________________________________   PHYSICAL EXAM:  VITAL SIGNS: ED Triage Vitals  Enc Vitals Group     BP 07/27/18 1033 105/74     Pulse Rate 07/27/18 1033 (!) 106     Resp 07/27/18 1033 18     Temp 07/27/18 1033 99 F (37.2 C)   Temp Source 07/27/18 1033 Oral     SpO2 07/27/18 1033 100 %     Weight 07/27/18 1035 280 lb (127 kg)     Height 07/27/18 1035 5\' 10"  (1.778 m)     Head Circumference --      Peak Flow --      Pain Score --      Pain Loc --      Pain Edu? --      Excl. in GC? --     Constitutional: Alert and oriented.  Relatively well appearing and in no acute distress. Eyes: Conjunctivae are normal.  Head: Atraumatic. Nose: No congestion/rhinnorhea. Mouth/Throat: Mucous membranes are moist.   Neck: Normal range of motion.  Cardiovascular: Normal rate, regular rhythm. Grossly normal heart sounds.  Good peripheral circulation. Respiratory: Normal respiratory effort.  No retractions. Lungs CTAB. Gastrointestinal:  No distention.  Musculoskeletal: No lower extremity edema.  Extremities warm and well perfused.  Neurologic:  Normal speech and language. No gross focal neurologic deficits are appreciated.  Skin:  Skin is warm and dry. No rash noted. Psychiatric: Mood and affect are normal. Speech and behavior are normal.  ____________________________________________   LABS (all labs ordered are listed, but only abnormal results are displayed)  Labs Reviewed  CBC - Abnormal; Notable for the following components:      Result Value   Platelets 136 (*)    All other components within normal limits  BASIC METABOLIC PANEL - Abnormal; Notable for  the following components:   Sodium 133 (*)    Glucose, Bld 121 (*)    Creatinine, Ser 1.31 (*)    Calcium 8.6 (*)    All other components within normal limits  NOVEL CORONAVIRUS, NAA (HOSPITAL ORDER, SEND-OUT TO REF LAB)   ____________________________________________  EKG   ____________________________________________  RADIOLOGY  CXR: Right basilar atelectasis with no focal infiltrate  ____________________________________________   PROCEDURES  Procedure(s) performed: No  Procedures  Critical Care performed: No  ____________________________________________   INITIAL IMPRESSION / ASSESSMENT AND PLAN / ED COURSE  Pertinent labs & imaging results that were available during my care of the patient were reviewed by me and considered in my medical decision making (see chart for details).  53 year old male with a history of hypertension presents with fever, shortness of breath, and some cough over the last several days.  The patient was tested for influenza and it was negative.  He has been on a Z-Pak without improvement.  He called the COVID-19 hotline and was instructed to come to the emergency department for testing.  He states that 2 of his coworkers are shared equipment with him travel to areas with outbreaks and subsequently were out of work, one to [place] and one to [place].  On exam, the patient is overall well-appearing.  He has a low-grade temperature and borderline tachycardia.  His O2 saturation is 100% on room air and he has no respiratory distress or increased work of breathing.  His lungs are clear on exam.  Labs and chest x-ray obtained from triage are reassuring.  Overall presentation is consistent with viral bronchitis although given the nature of the symptoms and the patient's history, COVID-19 is certainly possible.  We will send a test today.  At this time given his work-up and respiratory status, the patient does not require admission to the hospital.  I gave him extensive return precautions, and we have provided him both verbal and written quarantine instructions.  _____  Kenneth Briggs was evaluated in Emergency Department on 07/27/2018 for the symptoms described in the history of present illness. He was evaluated in the context of the global COVID-19 pandemic, which necessitated consideration that the patient might be at risk for infection with the SARS-CoV-2 virus that causes COVID-19. Institutional protocols and algorithms that pertain to the evaluation of patients at risk for  COVID-19 are in a state of rapid change based on information released by regulatory bodies including the CDC and federal and state organizations. These policies and algorithms were followed during the patient's care in the ED.  ____________________________________________   FINAL CLINICAL IMPRESSION(S) / ED DIAGNOSES  Final diagnoses:  Febrile illness      NEW MEDICATIONS STARTED DURING THIS VISIT:  New Prescriptions   No medications on file     Note:  This document was prepared using Dragon voice recognition software and may include unintentional dictation errors.    Dionne Bucy, MD 07/27/18 1153

## 2018-07-30 ENCOUNTER — Telehealth: Payer: Self-pay | Admitting: Emergency Medicine

## 2018-07-30 LAB — NOVEL CORONAVIRUS, NAA (HOSP ORDER, SEND-OUT TO REF LAB; TAT 18-24 HRS): SARS-CoV-2, NAA: DETECTED — AB

## 2018-07-30 NOTE — Telephone Encounter (Signed)
Lab calling to inform of + Covid-19 result

## 2018-07-30 NOTE — Telephone Encounter (Signed)
Called patient and informed him of covid 19 result.  Instructed to quaranteen to room until both 7 days after first sx and 72 hours with no symptoms.  Says he has been doing that already.  Informed that family members need 14 day quaranten.  Also informed of cdc website for further information.

## 2018-07-31 ENCOUNTER — Encounter (HOSPITAL_COMMUNITY): Payer: Self-pay

## 2018-07-31 ENCOUNTER — Inpatient Hospital Stay
Admission: EM | Admit: 2018-07-31 | Discharge: 2018-07-31 | DRG: 177 | Disposition: A | Discharge status: Other Acute Inpatient Hospital | Payer: BLUE CROSS/BLUE SHIELD | Attending: Internal Medicine | Admitting: Internal Medicine

## 2018-07-31 ENCOUNTER — Other Ambulatory Visit: Payer: Self-pay

## 2018-07-31 ENCOUNTER — Encounter: Payer: Self-pay | Admitting: Emergency Medicine

## 2018-07-31 ENCOUNTER — Emergency Department: Payer: BLUE CROSS/BLUE SHIELD

## 2018-07-31 ENCOUNTER — Inpatient Hospital Stay (HOSPITAL_COMMUNITY)
Admission: AD | Admit: 2018-07-31 | Discharge: 2018-08-10 | DRG: 177 | Disposition: A | Discharge status: Home / Self Care | Payer: BLUE CROSS/BLUE SHIELD | Source: Other Acute Inpatient Hospital | Attending: Internal Medicine | Admitting: Internal Medicine

## 2018-07-31 DIAGNOSIS — N400 Enlarged prostate without lower urinary tract symptoms: Secondary | ICD-10-CM | POA: Diagnosis present

## 2018-07-31 DIAGNOSIS — Z87891 Personal history of nicotine dependence: Secondary | ICD-10-CM

## 2018-07-31 DIAGNOSIS — R740 Nonspecific elevation of levels of transaminase and lactic acid dehydrogenase [LDH]: Secondary | ICD-10-CM | POA: Diagnosis not present

## 2018-07-31 DIAGNOSIS — F17200 Nicotine dependence, unspecified, uncomplicated: Secondary | ICD-10-CM | POA: Diagnosis present

## 2018-07-31 DIAGNOSIS — I10 Essential (primary) hypertension: Secondary | ICD-10-CM | POA: Diagnosis present

## 2018-07-31 DIAGNOSIS — Z888 Allergy status to other drugs, medicaments and biological substances status: Secondary | ICD-10-CM

## 2018-07-31 DIAGNOSIS — J810 Acute pulmonary edema: Secondary | ICD-10-CM | POA: Diagnosis not present

## 2018-07-31 DIAGNOSIS — J1289 Other viral pneumonia: Secondary | ICD-10-CM | POA: Diagnosis present

## 2018-07-31 DIAGNOSIS — Z6837 Body mass index (BMI) 37.0-37.9, adult: Secondary | ICD-10-CM | POA: Diagnosis not present

## 2018-07-31 DIAGNOSIS — J9601 Acute respiratory failure with hypoxia: Secondary | ICD-10-CM | POA: Diagnosis not present

## 2018-07-31 DIAGNOSIS — E876 Hypokalemia: Secondary | ICD-10-CM | POA: Diagnosis not present

## 2018-07-31 DIAGNOSIS — R0602 Shortness of breath: Secondary | ICD-10-CM | POA: Diagnosis present

## 2018-07-31 DIAGNOSIS — R0902 Hypoxemia: Secondary | ICD-10-CM

## 2018-07-31 DIAGNOSIS — L02214 Cutaneous abscess of groin: Secondary | ICD-10-CM | POA: Diagnosis present

## 2018-07-31 DIAGNOSIS — E669 Obesity, unspecified: Secondary | ICD-10-CM

## 2018-07-31 DIAGNOSIS — J988 Other specified respiratory disorders: Secondary | ICD-10-CM

## 2018-07-31 DIAGNOSIS — J069 Acute upper respiratory infection, unspecified: Secondary | ICD-10-CM | POA: Diagnosis present

## 2018-07-31 DIAGNOSIS — J309 Allergic rhinitis, unspecified: Secondary | ICD-10-CM | POA: Diagnosis present

## 2018-07-31 DIAGNOSIS — J80 Acute respiratory distress syndrome: Secondary | ICD-10-CM | POA: Diagnosis present

## 2018-07-31 DIAGNOSIS — J129 Viral pneumonia, unspecified: Secondary | ICD-10-CM

## 2018-07-31 LAB — BASIC METABOLIC PANEL
Anion gap: 11 (ref 5–15)
BUN: 19 mg/dL (ref 6–20)
CO2: 24 mmol/L (ref 22–32)
Calcium: 8.8 mg/dL — ABNORMAL LOW (ref 8.9–10.3)
Chloride: 103 mmol/L (ref 98–111)
Creatinine, Ser: 1.22 mg/dL (ref 0.61–1.24)
GFR calc Af Amer: >60 mL/min (ref >60)
GFR calc non Af Amer: >60 mL/min (ref >60)
Glucose, Bld: 113 mg/dL — ABNORMAL HIGH (ref 70–99)
Potassium: 3.9 mmol/L (ref 3.5–5.1)
Sodium: 138 mmol/L (ref 135–145)

## 2018-07-31 LAB — CBC WITH DIFFERENTIAL/PLATELET
Abs Immature Granulocytes: 0.04 10*3/uL (ref 0.00–0.07)
Basophils Absolute: 0 10*3/uL (ref 0.0–0.1)
Basophils Relative: 0 %
Eosinophils Absolute: 0 10*3/uL (ref 0.0–0.5)
Eosinophils Relative: 0 %
HCT: 39.5 % (ref 39.0–52.0)
Hemoglobin: 13 g/dL (ref 13.0–17.0)
Immature Granulocytes: 1 %
Lymphocytes Relative: 10 %
Lymphs Abs: 0.7 10*3/uL (ref 0.7–4.0)
MCH: 28.2 pg (ref 26.0–34.0)
MCHC: 32.9 g/dL (ref 30.0–36.0)
MCV: 85.7 fL (ref 80.0–100.0)
Monocytes Absolute: 0.3 10*3/uL (ref 0.1–1.0)
Monocytes Relative: 4 %
Neutro Abs: 5.8 10*3/uL (ref 1.7–7.7)
Neutrophils Relative %: 85 %
Platelets: 171 10*3/uL (ref 150–400)
RBC: 4.61 MIL/uL (ref 4.22–5.81)
RDW: 13.4 % (ref 11.5–15.5)
WBC: 6.9 10*3/uL (ref 4.0–10.5)
nRBC: 0 % (ref 0.0–0.2)

## 2018-07-31 LAB — INFLUENZA PANEL BY PCR (TYPE A & B)
Influenza A By PCR: NEGATIVE
Influenza B By PCR: NEGATIVE

## 2018-07-31 LAB — LACTIC ACID, PLASMA: Lactic Acid, Venous: 1.1 mmol/L (ref 0.5–1.9)

## 2018-07-31 MED ORDER — TAMSULOSIN HCL 0.4 MG PO CAPS
0.4000 mg | ORAL_CAPSULE | Freq: Every day | ORAL | Status: DC
  Administered 2018-08-02 – 2018-08-10 (×9): 0.4 mg via ORAL
  Filled 2018-07-31 (×9): qty 1

## 2018-07-31 MED ORDER — LORATADINE 10 MG PO TABS
10.0000 mg | ORAL_TABLET | Freq: Every day | ORAL | Status: DC
  Administered 2018-08-01 – 2018-08-10 (×10): 10 mg via ORAL
  Filled 2018-07-31 (×10): qty 1

## 2018-07-31 MED ORDER — SODIUM CHLORIDE 0.9% FLUSH: 3.0000 mL | INTRAVENOUS | Status: DC | PRN

## 2018-07-31 MED ORDER — ALBUTEROL SULFATE HFA 108 (90 BASE) MCG/ACT IN AERS
2.0000 | INHALATION_SPRAY | RESPIRATORY_TRACT | Status: DC
  Administered 2018-07-31: 2 via RESPIRATORY_TRACT
  Filled 2018-07-31: qty 6.7

## 2018-07-31 MED ORDER — HYDROXYCHLOROQUINE SULFATE 200 MG PO TABS
200.0000 mg | ORAL_TABLET | Freq: Two times a day (BID) | ORAL | Status: AC
  Administered 2018-08-01 – 2018-08-05 (×8): 200 mg via ORAL
  Filled 2018-07-31 (×9): qty 1

## 2018-07-31 MED ORDER — ALBUTEROL SULFATE (2.5 MG/3ML) 0.083% IN NEBU: 2.5000 mg | INHALATION_SOLUTION | RESPIRATORY_TRACT | Status: DC

## 2018-07-31 MED ORDER — ENOXAPARIN SODIUM 40 MG/0.4ML ~~LOC~~ SOLN
40.0000 mg | Freq: Every day | SUBCUTANEOUS | Status: DC
  Administered 2018-08-01 – 2018-08-05 (×5): 40 mg via SUBCUTANEOUS
  Filled 2018-07-31 (×5): qty 0.4

## 2018-07-31 MED ORDER — SODIUM CHLORIDE 0.9 % IV BOLUS
1000.0000 mL | Freq: Once | INTRAVENOUS | Status: AC
  Administered 2018-07-31: 1000 mL via INTRAVENOUS

## 2018-07-31 MED ORDER — SODIUM CHLORIDE 0.9% FLUSH
3.0000 mL | Freq: Two times a day (BID) | INTRAVENOUS | Status: DC
  Administered 2018-07-31 – 2018-08-09 (×12): 3 mL via INTRAVENOUS

## 2018-07-31 MED ORDER — SODIUM CHLORIDE 0.9 % IV SOLN: 250.0000 mL | INTRAVENOUS | Status: DC | PRN

## 2018-07-31 MED ORDER — HYDROXYCHLOROQUINE SULFATE 200 MG PO TABS
400.0000 mg | ORAL_TABLET | Freq: Two times a day (BID) | ORAL | Status: AC
  Administered 2018-07-31 – 2018-08-01 (×2): 400 mg via ORAL
  Filled 2018-07-31 (×2): qty 2

## 2018-07-31 NOTE — ED Provider Notes (Addendum)
[facility] Emergency Department Provider Note ____________________________________________   First MD Initiated Contact with Patient 07/31/18 1502     (approximate)  I have reviewed the triage vital signs and the nursing notes.   HISTORY  Chief Complaint Shortness of Breath; Fever; and Respiratory Distress    HPI Kenneth Briggs is a 53 y.o. male with PMH as noted below who presents with shortness of breath, gradual onset over the last several days, worsening course, initially associated with fever which has now resolved.  In addition the patient reports of diarrhea.  He was seen in the ED for the symptoms 4 days ago and has tested positive for COVID-19.  He received his positive test result yesterday.  Past Medical History:  Diagnosis Date  . Enlarged prostate   . Hypertension     Patient Active Problem List   Diagnosis Date Noted  . COVID-19 virus infection 07/31/2018    Past Surgical History:  Procedure Laterality Date  . VASECTOMY      Prior to Admission medications   Not on File    Allergies Lisinopril  History reviewed. No pertinent family history.  Social History Social History   Tobacco Use  . Smoking status: Former Games developer  . Smokeless tobacco: Never Used  Substance Use Topics  . Alcohol use: Not Currently  . Drug use: Not on file    Review of Systems  Constitutional: Positive for resolved fever. Eyes: No redness. ENT: No sore throat. Cardiovascular: Denies chest pain. Respiratory: Positive for shortness of breath. Gastrointestinal: Positive for diarrhea.  Genitourinary: Negative for flank pain.  Musculoskeletal: Negative for back pain. Skin: Negative for rash. Neurological: Negative for headache.   ____________________________________________   PHYSICAL EXAM:  VITAL SIGNS: ED Triage Vitals  Enc Vitals Group     BP 07/31/18 1508 135/77     Pulse Rate 07/31/18 1508 93     Resp 07/31/18 1508 (!) 24   Temp 07/31/18 1508 98.9 F (37.2 C)     Temp Source 07/31/18 1508 Oral     SpO2 07/31/18 1503 96 %     Weight 07/31/18 1509 279 lb 15.8 oz (127 kg)     Height 07/31/18 1509 5\' 10"  (1.778 m)     Head Circumference --      Peak Flow --      Pain Score 07/31/18 1509 0     Pain Loc --      Pain Edu? --      Excl. in GC? --     Constitutional: Alert and oriented.  Tired appearing but in no acute distress. Eyes: Conjunctivae are normal.  Head: Atraumatic. Nose: No congestion/rhinnorhea. Mouth/Throat: No stridor. Neck: Normal range of motion.  Cardiovascular: Normal rate, regular rhythm. Grossly normal heart sounds.  Good peripheral circulation. Respiratory: Increased respiratory effort.  Speaking in short sentences.  No retractions.  Gastrointestinal: No distention.  Musculoskeletal: No lower extremity edema.  Extremities warm and well perfused.  Neurologic:  Normal speech and language. No gross focal neurologic deficits are appreciated.  Skin:  Skin is warm and dry. No rash noted. Psychiatric: Mood and affect are normal. Speech and behavior are normal.  ____________________________________________   LABS (all labs ordered are listed, but only abnormal results are displayed)  Labs Reviewed  BASIC METABOLIC PANEL - Abnormal; Notable for the following components:      Result Value   Glucose, Bld 113 (*)    Calcium 8.8 (*)    All other components within  normal limits  CBC WITH DIFFERENTIAL/PLATELET  LACTIC ACID, PLASMA   ____________________________________________  EKG  ED ECG REPORT I, Dionne Bucy, the attending physician, personally viewed and interpreted this ECG.  Date: 07/31/2018 EKG Time: 1530 Rate: 91 Rhythm: normal sinus rhythm QRS Axis: normal Intervals: normal ST/T Wave abnormalities: normal Narrative Interpretation: no evidence of acute ischemia  ____________________________________________  RADIOLOGY  CXR: Bibasilar infiltrates   ____________________________________________   PROCEDURES  Procedure(s) performed: No  Procedures  Critical Care performed: No ____________________________________________   INITIAL IMPRESSION / ASSESSMENT AND PLAN / ED COURSE  Pertinent labs & imaging results that were available during my care of the patient were reviewed by me and considered in my medical decision making (see chart for details).  53 year old male with hypertension and other PMH as noted above presents with worsening shortness of breath over the last several days with a positive test for COVID-19.  I reviewed the past medical records in Epic.  I actually saw this patient in the ED on 07/27/2018 and tested him for COVID-19 although he did not require admission at that time.  On exam today the patient is uncomfortable appearing and tachypneic to the 30s.  His O2 saturation was in the low to mid 90s by EMS although on 2 L by nasal cannula he is in the high 90s now.  The remainder of his vital signs are normal and he is afebrile.  The exam is otherwise as described above.  Presentation is consistent with symptoms related to COVID-19.  Although he is not hypoxic I suspect that the patient will likely require admission due to his increased work of breathing and the potential for acute decompensation.  We will obtain chest x-ray, basic and cardiac labs, give a fluid bolus and albuterol by inhaler and reassess.  ----------------------------------------- 5:07 PM on 07/31/2018 -----------------------------------------  Chest x-ray shows bibasilar infiltrates consistent with viral pneumonia.  Although the patient is not hypoxic, given his tachypnea, increased work of breathing and the high risk for respiratory decompensation, he will require admission.    ----------------------------------------- 6:59 PM on 07/31/2018 -----------------------------------------  I initially signed the patient out to the hospitalist here,  however I was subsequently informed that we are now transferring all confirmed positive COVID-19 patients to [facility].  The patient is stable for transfer at this time.  I contacted the transfer center and discussed the case with the hospitalist there, Dr. Lajuana Ripple.  She accepted the transfer.   ______  Arvin Collard was evaluated in Emergency Department on 07/31/2018 for the symptoms described in the history of present illness. He was evaluated in the context of the global COVID-19 pandemic, which necessitated consideration that the patient might be at risk for infection with the SARS-CoV-2 virus that causes COVID-19. Institutional protocols and algorithms that pertain to the evaluation of patients at risk for COVID-19 are in a state of rapid change based on information released by regulatory bodies including the CDC and federal and state organizations. These policies and algorithms were followed during the patient's care in the ED.   ____________________________________________   FINAL CLINICAL IMPRESSION(S) / ED DIAGNOSES  Final diagnoses:  Viral pneumonia  COVID-19      NEW MEDICATIONS STARTED DURING THIS VISIT:  New Prescriptions   No medications on file     Note:  This document was prepared using Dragon voice recognition software and may include unintentional dictation errors.    Dionne Bucy, MD 07/31/18 [place]    Dionne Bucy, MD 07/31/18 1900

## 2018-07-31 NOTE — H&P (Signed)
History and Physical    NAJI MEHRINGER Briggs:[id number] DOB: 06/08/1965 DOA: 07/31/2018  PCP: System, Pcp Not In  Patient coming from: [facility] ED   Chief Complaint: fever, sob  HPI: Kenneth Briggs is a 53 y.o. male with medical history significant for htn, obesity, who presents with above.  No known covid contacts or recent travel.  Symptoms began one week ago. Began with fever, then developed occasional loose stools, cough, shortness of breath, a feeling of tightness in the chest worse with deep inspiration, headache, and body aches. This has progressively worsened, worse over past 1-2 days. Initially did a telehealth visit with his pcp, was told likely viral illness, with symptomatic mgmt. Then went to a [facility] urgent care, flu negative, but was prescribed tamiflu and azithromycin. Symptoms continued to progress. On 3/30 presented to [facility] ED, where covid test was performed. Clinically relatively well appearing, so discharged home. covid test has returned positive. When [place] health department called and spoke with him today (they had been calling daily since 3/30 covid test), his symptoms (mainly shortness of breath) had worsened such that they advised return to [facility] ED.  Pt also notes that about a month ago diagnosed with right groin abscess at pcp's. Has been draining. Completed a course of antibiotics. Says continues to drain but has continued to improve, now much improved from where things started.   In the ED requiring 2-3 L [place] O2, mild tachypnea.   ED Course: cxr, labs  Review of Systems: As per HPI otherwise 10 point review of systems negative.    Past Medical History:  Diagnosis Date  . Enlarged prostate   . Hypertension     Past Surgical History:  Procedure Laterality Date  . VASECTOMY       reports that he quit smoking about 25 years ago. He has a 14.00 pack-year smoking history. He has never used smokeless tobacco. He reports previous alcohol use. He reports that  he does not use drugs.  Allergies  Allergen Reactions  . Lisinopril Cough    History reviewed. No pertinent family history.  Prior to Admission medications   Medication Sig Start Date End Date Taking? Authorizing Provider  benzonatate (TESSALON) 200 MG capsule Take 200 mg by mouth 3 (three) times daily as needed for cough.  07/26/18  Yes [provider]  cetirizine (ZYRTEC) 10 MG tablet Take 10 mg by mouth at bedtime.   Yes [provider]  [employer] D3 50 MCG (2000 UT) CAPS Take 3 capsules by mouth daily.  07/04/18  Yes [provider]  [employer] VITAMIN B12 1000 MCG tablet Take 1,000 mcg by mouth daily.  07/04/18  Yes [provider]  guaiFENesin (MUCINEX) 600 MG 12 hr tablet Take 600 mg by mouth 2 (two) times daily.   Yes [provider]  tamsulosin (FLOMAX) 0.4 MG CAPS capsule Take 0.4 mg by mouth daily.  07/07/18  Yes [provider]  valsartan (DIOVAN) 160 MG tablet Take 160 mg by mouth every evening.  06/11/18  Yes [provider]    Physical Exam: Vitals:   07/31/18 2100 07/31/18 2129  Pulse:  (!) 110  Resp: (!) 30   SpO2:  97%  Weight: 124.5 kg   Height:  (1.778 m)     Constitutional: No acute distress, ill appearing Head: Atraumatic Eyes: Conjunctiva clear ENM: Moist mucous membranes. Normal dentition.  Neck: Supple Respiratory: scattered rhonchi Cardiovascular: tachycardic, regular rhythm. No murmurs/rubs/gallops. Abdomen: Non-tender, obese, non-distended. No  masses. No rebound or guarding. Positive bowel sounds. Musculoskeletal: No joint deformity upper and lower extremities. Normal ROM, no contractures. Normal muscle tone.  Skin: right groin several sinus tracks draining small amount of pus, minimal erythema/swelling Extremities: No peripheral edema. Palpable peripheral pulses. Neurologic: Alert, moving all 4 extremities. Psychiatric: Normal insight and judgement.   Labs on Admission: I have personally  reviewed following labs and imaging studies  CBC: Recent Labs  Lab 07/27/18 1047 07/31/18 1541  WBC 4.9 6.9  NEUTROABS  --  5.8  HGB 14.6 13.0  HCT 43.4 39.5  MCV 85.3 85.7  PLT 136* 171   Basic Metabolic Panel: Recent Labs  Lab 07/27/18 1047 07/31/18 1541  NA 133* 138  K 4.0 3.9  CL 99 103  CO2 24 24  GLUCOSE 121* 113*  BUN 18 19  CREATININE 1.31* 1.22  CALCIUM 8.6* 8.8*   GFR: Estimated Creatinine Clearance: 95.9 mL/min (by C-G formula based on SCr of 1.22 mg/dL). Liver Function Tests: No results for input(s): AST, ALT, ALKPHOS, BILITOT, PROT, ALBUMIN in the last 168 hours. No results for input(s): LIPASE, AMYLASE in the last 168 hours. No results for input(s): AMMONIA in the last 168 hours. Coagulation Profile: No results for input(s): INR, PROTIME in the last 168 hours. Cardiac Enzymes: No results for input(s): CKTOTAL, CKMB, CKMBINDEX, TROPONINI in the last 168 hours. BNP (last 3 results) No results for input(s): PROBNP in the last 8760 hours. HbA1C: No results for input(s): HGBA1C in the last 72 hours. CBG: No results for input(s): GLUCAP in the last 168 hours. Lipid Profile: No results for input(s): CHOL, HDL, LDLCALC, TRIG, CHOLHDL, LDLDIRECT in the last 72 hours. Thyroid Function Tests: No results for input(s): TSH, T4TOTAL, FREET4, T3FREE, THYROIDAB in the last 72 hours. Anemia Panel: No results for input(s): VITAMINB12, FOLATE, FERRITIN, TIBC, IRON, RETICCTPCT in the last 72 hours. Urine analysis: No results found for: COLORURINE, APPEARANCEUR, LABSPEC, PHURINE, GLUCOSEU, HGBUR, BILIRUBINUR, KETONESUR, PROTEINUR, UROBILINOGEN, NITRITE, LEUKOCYTESUR  Radiological Exams on Admission: Dg Chest Portable 1 View  Result Date: 07/31/2018 CLINICAL DATA:  Increasing shortness of breath and positive Covid-19 diagnosis EXAM: PORTABLE CHEST 1 VIEW COMPARISON:  07/27/2018 FINDINGS: Cardiac shadow is enlarged but stable. Increasing bibasilar infiltrates are noted  consistent with the patient's given clinical history. No sizable effusion is seen. No bony abnormality is noted. IMPRESSION: Increasing bibasilar infiltrates. Electronically Signed   By: Alcide Clever M.D.   On: 07/31/2018 15:39     Assessment/Plan Principal Problem:   COVID-19 virus infection Active Problems:   Essential hypertension   Obesity   BPH (benign prostatic hyperplasia)   Abscess of groin, right   Acute respiratory disease due to COVID-19 virus   # Covid-19 infection - test positive, with hypoxia requiring 2-3 L [place] and mild respiratory distress. Platelets 130s on 3/30, increased to 171 today. - hydroxychloroquine ordered (ID Dr. Luciana Axe advised), advised holding on abx - [place] O2 - D5 1/2 NS @ 75 - EKG to assess qtc (given plaquenil) - AM cbc/cmp - incentive spirometry - f/u am CRP, ferritin, d-dimer - repeat flu test (can't find confirmation of negative result) - precautions: standard, droplet, contact (low risk)  # Groin abscess - present for about a month, pt reports good healing, now appears to have developed several sinus tracts that are draining small amount of pus.  - holding on abx given pt report continued improvement off abx for several weeks - daily sitz baths - ctm  # htn - here  normotensive - hole home valsartan given acute illness and normotension  # bph # allergic rhinitis - cont home flomax and cetirizine  DVT prophylaxis: lovenox Code Status: full  Family Communication: wife rebecca [phone]  Disposition Plan: tbd  Consults called: ID comer  Admission status: med/surg    Silvano Bilis MD [facility] Pager [phone]  If 7PM-7AM, please contact night-coverage [email or web address] Password [email or web address]  07/31/2018, 10:10 PM

## 2018-07-31 NOTE — ED Notes (Signed)
carelink here 

## 2018-07-31 NOTE — Plan of Care (Signed)
Plan of care discussed.  Incentive Spirometer to 500, encouraged Q 1 hr while awake.

## 2018-07-31 NOTE — ED Triage Notes (Signed)
Patient arrives via EMS from home with positive COVID19 dx and increasing shortness of breath. Patient reports positive results called to him yesterday, fever has improved however shortness of breath and cough is much worse.

## 2018-07-31 NOTE — ED Notes (Signed)
ED TO INPATIENT HANDOFF REPORT  ED Nurse Name and Phone #: Jae Dire [phone]  S Name/Age/Gender Kenneth Briggs 53 y.o. male Room/Bed: ED32A/ED32A  Code Status   Code Status: Not on file  Home/SNF/Other Home Patient oriented to: self, place, time and situation Is this baseline? Yes   Triage Complete: Triage complete  Chief Complaint SOB  Triage Note Patient arrives via EMS from home with positive COVID19 dx and increasing shortness of breath. Patient reports positive results called to him yesterday, fever has improved however shortness of breath and cough is much worse.   Allergies Allergies  Allergen Reactions  . Lisinopril Cough    Level of Care/Admitting Diagnosis ED Disposition    ED Disposition Condition Comment   Admit  Hospital Area: [facility] [[id number]]  Level of Care: Med-Surg [16]  Diagnosis: COVID-19 virus infection [[id number]]  Admitting Physician: Ihor Austin [[id number]]  Attending Physician: Ihor Austin [[id number]]  Estimated length of stay: past midnight tomorrow  Certification:: I certify this patient will need inpatient services for at least 2 midnights  PT Class (Do Not Modify): Inpatient [101]  PT Acc Code (Do Not Modify): Private [1]       B Medical/Surgery History Past Medical History:  Diagnosis Date  . Enlarged prostate   . Hypertension    Past Surgical History:  Procedure Laterality Date  . VASECTOMY       A IV Location/Drains/Wounds Patient Lines/Drains/Airways Status   Active Line/Drains/Airways    Name:   Placement date:   Placement time:   Site:   Days:   Peripheral IV 07/31/18 Right Antecubital   07/31/18    1540    Antecubital   less than 1          Intake/Output Last 24 hours No intake or output data in the 24 hours ending 07/31/18 1708  Labs/Imaging Results for orders placed or performed during the hospital encounter of 07/31/18 (from the past 48 hour(s))  Basic metabolic panel     Status:  Abnormal   Collection Time: 07/31/18  3:41 PM  Result Value Ref Range   Sodium 138 135 - 145 mmol/L   Potassium 3.9 3.5 - 5.1 mmol/L   Chloride 103 98 - 111 mmol/L   CO2 24 22 - 32 mmol/L   Glucose, Bld 113 (H) 70 - 99 mg/dL   BUN 19 6 - 20 mg/dL   Creatinine, Ser 7.51 0.61 - 1.24 mg/dL   Calcium 8.8 (L) 8.9 - 10.3 mg/dL   GFR calc non Af Amer >60 >60 mL/min   GFR calc Af Amer >60 >60 mL/min   Anion gap 11 5 - 15    Comment: Performed at [facility], [address] [place], [place] [address]  CBC with Differential     Status: None   Collection Time: 07/31/18  3:41 PM  Result Value Ref Range   WBC 6.9 4.0 - 10.5 K/uL   RBC 4.61 4.22 - 5.81 MIL/uL   Hemoglobin 13.0 13.0 - 17.0 g/dL   HCT 49.4 49.6 - 75.9 %   MCV 85.7 80.0 - 100.0 fL   MCH 28.2 26.0 - 34.0 pg   MCHC 32.9 30.0 - 36.0 g/dL   RDW 16.3 84.6 - 65.9 %   Platelets 171 150 - 400 K/uL   nRBC 0.0 0.0 - 0.2 %   Neutrophils Relative % 85 %   Neutro Abs 5.8 1.7 - 7.7 K/uL   Lymphocytes Relative 10 %   Lymphs  Abs 0.7 0.7 - 4.0 K/uL   Monocytes Relative 4 %   Monocytes Absolute 0.3 0.1 - 1.0 K/uL   Eosinophils Relative 0 %   Eosinophils Absolute 0.0 0.0 - 0.5 K/uL   Basophils Relative 0 %   Basophils Absolute 0.0 0.0 - 0.1 K/uL   Immature Granulocytes 1 %   Abs Immature Granulocytes 0.04 0.00 - 0.07 K/uL    Comment: Performed at [facility], [address] [place], [place] [address]  Lactic acid, plasma     Status: None   Collection Time: 07/31/18  3:42 PM  Result Value Ref Range   Lactic Acid, Venous 1.1 0.5 - 1.9 mmol/L    Comment: Performed at [facility], [address], [place], [place] [address]   Dg Chest Portable 1 View  Result Date: 07/31/2018 CLINICAL DATA:  Increasing shortness of breath and positive Covid-19 diagnosis EXAM: PORTABLE CHEST 1 VIEW COMPARISON:  07/27/2018 FINDINGS: Cardiac shadow is enlarged but stable. Increasing bibasilar infiltrates are noted consistent  with the patient's given clinical history. No sizable effusion is seen. No bony abnormality is noted. IMPRESSION: Increasing bibasilar infiltrates. Electronically Signed   By: Alcide Clever M.D.   On: 07/31/2018 15:39    Pending Labs Unresulted Labs (From admission, onward)    Start     Ordered   07/31/18 1513  Lactic acid, plasma  Now then every 2 hours,   STAT     07/31/18 1513   Signed and Held  HIV antibody (Routine Testing)  Once,   R     Signed and Held   Signed and Held  CBC  (enoxaparin (LOVENOX)    CrCl >/= 30 ml/min)  Once,   R    Comments:  Baseline for enoxaparin therapy IF NOT ALREADY DRAWN.  Notify MD if PLT < 100 K.    Signed and Held   Signed and Held  Creatinine, serum  (enoxaparin (LOVENOX)    CrCl >/= 30 ml/min)  Once,   R    Comments:  Baseline for enoxaparin therapy IF NOT ALREADY DRAWN.    Signed and Held   Signed and Held  Creatinine, serum  (enoxaparin (LOVENOX)    CrCl >/= 30 ml/min)  Weekly,   R    Comments:  while on enoxaparin therapy    Signed and Held   Signed and Held  Basic metabolic panel  Tomorrow morning,   R     Signed and Held   Signed and Held  CBC  Tomorrow morning,   R     Signed and Held          Vitals/Pain Today's Vitals   07/31/18 1508 07/31/18 1509 07/31/18 1546 07/31/18 1625  BP: 135/77   127/81  Pulse: 93  91 88  Resp: (!) 24  (!) 35 (!) 25  Temp: 98.9 F (37.2 C)     TempSrc: Oral     SpO2: 97%  98% 99%  Weight:  127 kg    Height:  5\' 10"  (1.778 m)    PainSc:  0-No pain      Isolation Precautions Airborne and Contact precautions  Medications Medications  albuterol (PROVENTIL HFA;VENTOLIN HFA) 108 (90 Base) MCG/ACT inhaler 2 puff (2 puffs Inhalation Given 07/31/18 1627)  sodium chloride 0.9 % bolus 1,000 mL (1,000 mLs Intravenous New Bag/Given 07/31/18 1544)    Mobility walks Low fall risk   Focused Assessments Pulmonary Assessment Handoff:  Lung sounds: L Breath Sounds: Diminished R  Breath Sounds: Diminished O2  Device: Nasal Cannula O2 Flow Rate (L/min): 4 L/min      R Recommendations: See Admitting Provider Note  Report given to:   Additional Notes: COVID +

## 2018-07-31 NOTE — Progress Notes (Signed)
Received report for Patient 19:46-19:54 from Duard Brady RN at [facility]. Informed of droplet precaution.  Called [phone] Infection Prevention.  Edythe Lynn RN who when given information of worsening condition, on 4 L N/C, can not speak full sentences D/T dyspnea, resp 25/min. That patient may need Airborne Precautions.  Informed CN and will inform provider when Patient arrives.

## 2018-07-31 NOTE — ED Notes (Signed)
EMTALA reviewed by this RN.  

## 2018-07-31 NOTE — ED Notes (Signed)
Report called to Brantley Stage, RN at [facility]) [phone]

## 2018-07-31 NOTE — ED Notes (Signed)
RN touched base with Diplomatic Services operational officer regarding transfer to [facility], we do not have bed assignment for patient at [facility] yet. Will follow

## 2018-08-01 DIAGNOSIS — J9601 Acute respiratory failure with hypoxia: Secondary | ICD-10-CM

## 2018-08-01 DIAGNOSIS — I10 Essential (primary) hypertension: Secondary | ICD-10-CM

## 2018-08-01 LAB — CBC WITH DIFFERENTIAL/PLATELET
Abs Immature Granulocytes: 0.09 10*3/uL — ABNORMAL HIGH (ref 0.00–0.07)
Basophils Absolute: 0 10*3/uL (ref 0.0–0.1)
Basophils Relative: 0 %
Eosinophils Absolute: 0 10*3/uL (ref 0.0–0.5)
Eosinophils Relative: 0 %
HCT: 37.8 % — ABNORMAL LOW (ref 39.0–52.0)
Hemoglobin: 12 g/dL — ABNORMAL LOW (ref 13.0–17.0)
Immature Granulocytes: 1 %
Lymphocytes Relative: 12 %
Lymphs Abs: 0.9 10*3/uL (ref 0.7–4.0)
MCH: 28.1 pg (ref 26.0–34.0)
MCHC: 31.7 g/dL (ref 30.0–36.0)
MCV: 88.5 fL (ref 80.0–100.0)
Monocytes Absolute: 0.3 10*3/uL (ref 0.1–1.0)
Monocytes Relative: 3 %
Neutro Abs: 6.7 10*3/uL (ref 1.7–7.7)
Neutrophils Relative %: 84 %
Platelets: 185 10*3/uL (ref 150–400)
RBC: 4.27 MIL/uL (ref 4.22–5.81)
RDW: 13.6 % (ref 11.5–15.5)
WBC: 7.9 10*3/uL (ref 4.0–10.5)
nRBC: 0 % (ref 0.0–0.2)

## 2018-08-01 LAB — COMPREHENSIVE METABOLIC PANEL
ALT: 57 U/L — ABNORMAL HIGH (ref 0–44)
AST: 69 U/L — ABNORMAL HIGH (ref 15–41)
Albumin: 3.2 g/dL — ABNORMAL LOW (ref 3.5–5.0)
Alkaline Phosphatase: 27 U/L — ABNORMAL LOW (ref 38–126)
Anion gap: 8 (ref 5–15)
BUN: 16 mg/dL (ref 6–20)
CO2: 24 mmol/L (ref 22–32)
Calcium: 8.1 mg/dL — ABNORMAL LOW (ref 8.9–10.3)
Chloride: 107 mmol/L (ref 98–111)
Creatinine, Ser: 1.16 mg/dL (ref 0.61–1.24)
GFR calc Af Amer: >60 mL/min (ref >60)
GFR calc non Af Amer: >60 mL/min (ref >60)
Glucose, Bld: 121 mg/dL — ABNORMAL HIGH (ref 70–99)
Potassium: 3.8 mmol/L (ref 3.5–5.1)
Sodium: 139 mmol/L (ref 135–145)
Total Bilirubin: 0.5 mg/dL (ref 0.3–1.2)
Total Protein: 6.8 g/dL (ref 6.5–8.1)

## 2018-08-01 LAB — D-DIMER, QUANTITATIVE: D-Dimer, Quant: 0.69 ug/mL-FEU — ABNORMAL HIGH (ref 0.00–0.50)

## 2018-08-01 LAB — C-REACTIVE PROTEIN: CRP: 17 mg/dL — ABNORMAL HIGH (ref <1.0)

## 2018-08-01 LAB — MRSA PCR SCREENING: MRSA by PCR: NEGATIVE

## 2018-08-01 LAB — FERRITIN: Ferritin: 722 ng/mL — ABNORMAL HIGH (ref 24–336)

## 2018-08-01 MED ORDER — VITAMIN C 500 MG PO TABS
1000.0000 mg | ORAL_TABLET | Freq: Every day | ORAL | Status: DC
  Administered 2018-08-01 – 2018-08-10 (×10): 1000 mg via ORAL
  Filled 2018-08-01 (×10): qty 2

## 2018-08-01 MED ORDER — ACETAMINOPHEN 325 MG PO TABS
650.0000 mg | ORAL_TABLET | Freq: Four times a day (QID) | ORAL | Status: DC | PRN
  Administered 2018-08-01 – 2018-08-02 (×4): 650 mg via ORAL
  Filled 2018-08-01 (×5): qty 2

## 2018-08-01 MED ORDER — DEXTROSE-NACL 5-0.45 % IV SOLN
INTRAVENOUS | Status: DC
  Administered 2018-08-01 – 2018-08-03 (×6): via INTRAVENOUS

## 2018-08-01 NOTE — Progress Notes (Signed)
[facility] PROGRESS NOTE  Kenneth Briggs BJY:[id number] DOB: 01/06/1966 DOA: 07/31/2018  PCP: System, Pcp Not In  Brief History/Interval Summary: 53 y.o. male with medical history significant for htn, obesity, who presented with complains of fever and shortness of breath.  Symptoms began about 1 week prior to hospitalization.  Patient was evaluated in urgent care.  COVID-19 test was ordered.  Since he was stable he was asked to self isolate at home.  Has results to come back positive.  Subsequently he developed more shortness of breath and so decided to come into the hospital at the recommendation of the health department.  He is requiring oxygen at 4-1/2 L/min currently.  Reason for Visit: Acute respiratory failure with hypoxia secondary to COVID-19  Consultants: None  Procedures: None  Antibiotics: None on the advice of ID.  Apparently completed a course of azithromycin recently.  Subjective/Interval History: Patient states that his shortness of breath is about the same as yesterday.  He does feel fatigued.  Cough appears to be not as severe as yesterday.  No headaches.  ROS: No nausea or vomiting.  No diarrhea.  Objective:  Vital Signs  Vitals:   08/01/18 0611 08/01/18 0802 08/01/18 0806 08/01/18 1005  BP: 112/65  109/73 (!) 111/58  Pulse: 86  91 92  Resp: (!) 28  (!) 24 (!) 22  Temp: 99.5 F (37.5 C)  99.2 F (37.3 C) (!) 100.6 F (38.1 C)  TempSrc: Oral Oral Oral Oral  SpO2: 94%  94% 97%  Weight:      Height:        Intake/Output Summary (Last 24 hours) at 08/01/2018 1019 Last data filed at 08/01/2018 0600 Gross per 24 hour  Intake 1555.65 ml  Output 700 ml  Net 855.65 ml   Filed Weights   07/31/18 2100 07/31/18 2129  Weight: 124.5 kg 124.5 kg    General appearance: alert, cooperative, appears stated age, no distress and morbidly obese Head: Normocephalic, without obvious abnormality, atraumatic Resp: Diminished air entry at the bases with a few  crackles.  No wheezing or rhonchi.  Coarse breath sounds. Cardio: regular rate and rhythm, S1, S2 normal, no murmur, click, rub or gallop GI: soft, non-tender; bowel sounds normal; no masses,  no organomegaly Extremities: extremities normal, atraumatic, no cyanosis or edema Pulses: 2+ and symmetric Skin: Skin color, texture, turgor normal. No rashes or lesions Neurologic: No focal neurological deficits.  Alert and oriented x3.  Lab Results:  Data Reviewed: I have personally reviewed following labs and imaging studies  CBC: Recent Labs  Lab 07/27/18 1047 07/31/18 1541 08/01/18 0329  WBC 4.9 6.9 7.9  NEUTROABS  --  5.8 6.7  HGB 14.6 13.0 12.0*  HCT 43.4 39.5 37.8*  MCV 85.3 85.7 88.5  PLT 136* 171 185    Basic Metabolic Panel: Recent Labs  Lab 07/27/18 1047 07/31/18 1541 08/01/18 0329  NA 133* 138 139  K 4.0 3.9 3.8  CL 99 103 107  CO2 GLUCOSE 121* 113* 121*  BUN CREATININE 1.31* 1.22 1.16  CALCIUM 8.6* 8.8* 8.1*    GFR: Estimated Creatinine Clearance: 100.9 mL/min (by C-G formula based on SCr of 1.16 mg/dL).  Liver Function Tests: Recent Labs  Lab 08/01/18 0329  AST 69*  ALT 57*  ALKPHOS 27*  BILITOT 0.5  PROT 6.8  ALBUMIN 3.2*    Anemia Panel: Recent Labs    08/01/18 0329  FERRITIN 722*  Recent Results (from the past 240 hour(s))  Novel Coronavirus, NAA (hospital order; send-out to ref lab)     Status: Abnormal   Collection Time: 07/27/18 11:33 AM  Result Value Ref Range Status   SARS-CoV-2, NAA DETECTED (A) NOT DETECTED Final    Comment: Positive (Detected) results are indicative of active infection with SARS CoV 2. A positive result does not rule out bacterial infection or coinfection with other viruses. Detection of SARS CoV 2 viral RNA may not indicate that SARS CoV 2 is the causative agent for clinical symptoms. Positive and negative predictive values of testing are highly dependent on prevalence. False positive test  results are more likely when prevalence is moderate to low. CRITICAL RESULT CALLED TO, READ BACK BY AND VERIFIED WITH: RN Evalee Jefferson [phone] MLM (NOTE) The expected result is Negative (Not Detected). The SARS CoV 2 test is intended for the presumptive qualitative  detection of nucleic acid from SARS CoV 2 in upper and lower  respiratory specimens. Testing methodology is real time RT PCR. Test results must be correlated with clinical presentation and  evaluated in the context of other laboratory and epidemiologic data.  Test performance can be affected because the epidemiology and  clinica l spectrum of infection caused by SARS CoV 2 is not fully  known. For example, the optimum types of specimens to collect and  when during the course of infection these specimens are most likely  to contain detectable viral RNA may not be known. This test has not been Food and Drug Administration (FDA) cleared or  approved and has been authorized by FDA under an Emergency Use  Authorization (EUA). The test is only authorized for the duration of  the declaration that circumstances exist justifying the authorization  of emergency use of in vitro diagnostic tests for detection and or  diagnosis of SARS CoV 2 under Section 564(b)(1) of the Act, 21 U.S.C.  section [phone] 3(b)(1), unless the authorization is terminated or  revoked sooner. [facility] is certified under the  Clinical Laboratory Improvement Amendments of 1988 (CLIA), 42 U.S.C.  section [phone], to perform high complexity tests. Performed at [facility] CLIA [id number] [address], [address], [place], [place] [address] Laboratory Director: Turner Daniels, MD Performed at [facility], [address], [place], [place] [address]    Coronavirus Source NASOPHARYNGEAL  Final    Comment: Performed at [facility], [address] [place], [place] [address]      Radiology Studies:  Dg Chest Portable 1 View  Result Date: 07/31/2018 CLINICAL DATA:  Increasing shortness of breath and positive Covid-19 diagnosis EXAM: PORTABLE CHEST 1 VIEW COMPARISON:  07/27/2018 FINDINGS: Cardiac shadow is enlarged but stable. Increasing bibasilar infiltrates are noted consistent with the patient's given clinical history. No sizable effusion is seen. No bony abnormality is noted. IMPRESSION: Increasing bibasilar infiltrates. Electronically Signed   By: Alcide Clever M.D.   On: 07/31/2018 15:39     Medications:  Scheduled: . enoxaparin (LOVENOX) injection  40 mg Subcutaneous Daily  . hydroxychloroquine  200 mg Oral BID  . loratadine  10 mg Oral Daily  . sodium chloride flush  3 mL Intravenous Q12H  . tamsulosin  0.4 mg Oral Daily  . vitamin C  1,000 mg Oral Daily   Continuous: . sodium chloride    . dextrose 5 % and 0.45% NaCl 75 mL/hr at 08/01/18 0600   FFM:BWGYKZ chloride, acetaminophen, sodium  chloride flush    Assessment/Plan:  PPE Date: 08/01/2018   Patient Isolation: Airborne+Contact  HCP PPE:  Wearing N95 mask + eye protection+ Gown + Gloves Patient PPE: None   Acute respiratory failure with hypoxia secondary to COVID-19 Requiring 4-1/2 L of oxygen by nasal cannula.  He will be changed over to high risk precautions.  Per hospital protocol he will be transferred to stepdown unit.  His ferritin is 722.  CRP 17.  Lactic acid level was 1.1.  D-dimer 0.69.  Influenza PCR was negative.  Chest x-ray did show increasing bibasilar infiltrates.  Patient recently completed a course of azithromycin.  No clear indication to initiate the same at this time.  This was discussed with ID as well.  Hydroxychloroquine has been initiated.  Monitor QTc intervals.  408 ms this morning.  On vitamin C.  LFTs noted to be mildly elevated. Continue to monitor closely as patient is on hydroxychloroquine.  History of groin abscess Apparently present for a month.  Apparently has sinus tracts draining  small amount of pus.  WBC is normal.  Local wound care.  Daily sitz bath.  Essential hypertension Monitor blood pressures closely.  Holding valsartan.  History of BPH Stable.  DVT Prophylaxis: Lovenox    Code Status: Full code Family Communication: Discussed with the patient Disposition Plan: Management as outlined above    LOS: 1 day   Areon Cocuzza [employer] on [email or web address]  08/01/2018, 10:19 AM

## 2018-08-01 NOTE — Progress Notes (Signed)
Tele video call provided to family for patient to wish his daughter happy birthday.

## 2018-08-01 NOTE — Progress Notes (Signed)
Pt transfer to stepdown. RN's from unit here to transfer.

## 2018-08-02 ENCOUNTER — Inpatient Hospital Stay (HOSPITAL_COMMUNITY): Payer: BLUE CROSS/BLUE SHIELD

## 2018-08-02 DIAGNOSIS — J069 Acute upper respiratory infection, unspecified: Secondary | ICD-10-CM

## 2018-08-02 LAB — BLOOD GAS, ARTERIAL
Acid-Base Excess: 0.2 mmol/L (ref 0.0–2.0)
Acid-Base Excess: 0.8 mmol/L (ref 0.0–2.0)
Acid-base deficit: 0.9 mmol/L (ref 0.0–2.0)
Bicarbonate: 23.2 mmol/L (ref 20.0–28.0)
Bicarbonate: 22.5 mmol/L (ref 20.0–28.0)
Bicarbonate: 24.2 mmol/L (ref 20.0–28.0)
Drawn by: 257701
Drawn by: 257701
Drawn by: 308601
O2 Content: 7 L/min
O2 Content: 7 L/min
O2 Content: 8 L/min
O2 Saturation: 92.6 %
O2 Saturation: 95.5 %
O2 Saturation: 96 %
Patient temperature: 99.5
Patient temperature: 97.9
Patient temperature: 98.6
pCO2 arterial: 35.1 mmHg (ref 32.0–48.0)
pCO2 arterial: 34.6 mmHg (ref 32.0–48.0)
pCO2 arterial: 36.2 mmHg (ref 32.0–48.0)
pH, Arterial: 7.439 (ref 7.350–7.450)
pH, Arterial: 7.428 (ref 7.350–7.450)
pH, Arterial: 7.441 (ref 7.350–7.450)
pO2, Arterial: 65.7 mmHg — ABNORMAL LOW (ref 83.0–108.0)
pO2, Arterial: 74.6 mmHg — ABNORMAL LOW (ref 83.0–108.0)
pO2, Arterial: 78.5 mmHg — ABNORMAL LOW (ref 83.0–108.0)

## 2018-08-02 LAB — COMPREHENSIVE METABOLIC PANEL
ALT: 60 U/L — ABNORMAL HIGH (ref 0–44)
AST: 63 U/L — ABNORMAL HIGH (ref 15–41)
Albumin: 3.2 g/dL — ABNORMAL LOW (ref 3.5–5.0)
Alkaline Phosphatase: 28 U/L — ABNORMAL LOW (ref 38–126)
Anion gap: 10 (ref 5–15)
BUN: 18 mg/dL (ref 6–20)
CO2: 22 mmol/L (ref 22–32)
Calcium: 8.5 mg/dL — ABNORMAL LOW (ref 8.9–10.3)
Chloride: 107 mmol/L (ref 98–111)
Creatinine, Ser: 0.98 mg/dL (ref 0.61–1.24)
GFR calc Af Amer: >60 mL/min (ref >60)
GFR calc non Af Amer: >60 mL/min (ref >60)
Glucose, Bld: 125 mg/dL — ABNORMAL HIGH (ref 70–99)
Potassium: 3.7 mmol/L (ref 3.5–5.1)
Sodium: 139 mmol/L (ref 135–145)
Total Bilirubin: 0.7 mg/dL (ref 0.3–1.2)
Total Protein: 7.7 g/dL (ref 6.5–8.1)

## 2018-08-02 LAB — CBC WITH DIFFERENTIAL/PLATELET
Abs Immature Granulocytes: 0.19 10*3/uL — ABNORMAL HIGH (ref 0.00–0.07)
Basophils Absolute: 0 10*3/uL (ref 0.0–0.1)
Basophils Relative: 0 %
Eosinophils Absolute: 0 10*3/uL (ref 0.0–0.5)
Eosinophils Relative: 0 %
HCT: 40.3 % (ref 39.0–52.0)
Hemoglobin: 13 g/dL (ref 13.0–17.0)
Immature Granulocytes: 2 %
Lymphocytes Relative: 11 %
Lymphs Abs: 1.1 10*3/uL (ref 0.7–4.0)
MCH: 28.4 pg (ref 26.0–34.0)
MCHC: 32.3 g/dL (ref 30.0–36.0)
MCV: 88 fL (ref 80.0–100.0)
Monocytes Absolute: 0.3 10*3/uL (ref 0.1–1.0)
Monocytes Relative: 3 %
Neutro Abs: 8.4 10*3/uL — ABNORMAL HIGH (ref 1.7–7.7)
Neutrophils Relative %: 84 %
Platelets: 244 10*3/uL (ref 150–400)
RBC: 4.58 MIL/uL (ref 4.22–5.81)
RDW: 13.9 % (ref 11.5–15.5)
WBC: 10.1 10*3/uL (ref 4.0–10.5)
nRBC: 0 % (ref 0.0–0.2)

## 2018-08-02 LAB — CK: Total CK: 357 U/L (ref 49–397)

## 2018-08-02 LAB — MAGNESIUM: Magnesium: 2.3 mg/dL (ref 1.7–2.4)

## 2018-08-02 LAB — FERRITIN: Ferritin: 996 ng/mL — ABNORMAL HIGH (ref 24–336)

## 2018-08-02 LAB — LACTATE DEHYDROGENASE: LDH: 358 U/L — ABNORMAL HIGH (ref 98–192)

## 2018-08-02 LAB — C-REACTIVE PROTEIN: CRP: 21.6 mg/dL — ABNORMAL HIGH (ref <1.0)

## 2018-08-02 LAB — D-DIMER, QUANTITATIVE: D-Dimer, Quant: 0.93 ug/mL-FEU — ABNORMAL HIGH (ref 0.00–0.50)

## 2018-08-02 MED ORDER — LOPERAMIDE HCL 2 MG PO CAPS
2.0000 mg | ORAL_CAPSULE | Freq: Three times a day (TID) | ORAL | Status: DC | PRN
  Administered 2018-08-02: 2 mg via ORAL
  Filled 2018-08-02: qty 1

## 2018-08-02 MED ORDER — ALBUTEROL SULFATE HFA 108 (90 BASE) MCG/ACT IN AERS
4.0000 | INHALATION_SPRAY | RESPIRATORY_TRACT | Status: DC | PRN
  Administered 2018-08-02: 4 via RESPIRATORY_TRACT
  Filled 2018-08-02 (×2): qty 6.7

## 2018-08-02 MED ORDER — TOCILIZUMAB 400 MG/20ML IV SOLN
400.0000 mg | Freq: Once | INTRAVENOUS | Status: AC
  Administered 2018-08-02: 400 mg via INTRAVENOUS
  Filled 2018-08-02: qty 20

## 2018-08-02 MED ORDER — HYDROXYCHLOROQUINE SULFATE 200 MG PO TABS: 400.0000 mg | ORAL_TABLET | Freq: Two times a day (BID) | ORAL | Status: DC

## 2018-08-02 MED ORDER — HYDROXYCHLOROQUINE SULFATE 200 MG PO TABS: 200.0000 mg | ORAL_TABLET | Freq: Two times a day (BID) | ORAL | Status: DC

## 2018-08-02 NOTE — Progress Notes (Signed)
[facility] PROGRESS NOTE  Arvin CollardDavid S Schuff JYN:[id number] DOB: 07/20/1965 DOA: 07/31/2018  PCP: System, Pcp Not In  Brief History/Interval Summary: 53 y.o. male with medical history significant for htn, obesity, who presented with complains of fever and shortness of breath.  Symptoms began about 1 week prior to hospitalization.  Patient was evaluated in urgent care.  COVID-19 test was ordered.  Since he was stable he was asked to self isolate at home.  Has results to come back positive.  Subsequently he developed more shortness of breath and so decided to come into the hospital at the recommendation of the health department.   Reason for Visit: Acute respiratory failure with hypoxia secondary to COVID-19  Consultants: None  Procedures: None  Antibiotics: None on the advice of ID.  He completed a course of azithromycin recently.  Subjective/Interval History: Patient states that he feels more short of breath this morning.  Not having any chest pain.  But still has a cough but does not know what color is the sputum.  Denies any nausea vomiting.  He is feeling fatigued.    ROS: No nausea vomiting.  Did have some loose stools overnight.  Objective:  Vital Signs  Vitals:   08/02/18 0600 08/02/18 0632 08/02/18 0700 08/02/18 0800  BP: 97/65  104/74 113/74  Pulse: 71 69 70 83  Resp: (!) 43 19 (!) 27 (!) 22  Temp:      TempSrc:      SpO2: 97% 96% 96% 94%  Weight:      Height:        Intake/Output Summary (Last 24 hours) at 08/02/2018 0956 Last data filed at 08/02/2018 0800 Gross per 24 hour  Intake 2315.62 ml  Output 675 ml  Net 1640.62 ml   Filed Weights   07/31/18 2100 07/31/18 2129  Weight: 124.5 kg 124.5 kg   General appearance: Awake alert.  In no distress.  Does appear to be fatigued. Resp: Tachypneic.  Using some accessory muscles.  Coarse breath sounds bilaterally with crackles.  Few wheezes appreciated on the right side.   Cardio: S1-S2 is normal regular.  No  S3-S4.  No rubs murmurs or bruit GI: Abdomen is soft.  Nontender nondistended.  Bowel sounds are present normal.  No masses organomegaly Extremities: No edema.  Full range of motion of lower extremities. Neurologic: Alert and oriented x3.  No focal neurological deficits.    Lab Results:  Data Reviewed: I have personally reviewed following labs and imaging studies  CBC: Recent Labs  Lab 07/27/18 1047 07/31/18 1541 08/01/18 0329 08/02/18 0203  WBC 4.9 6.9 7.9 10.1  NEUTROABS  --  5.8 6.7 8.4*  HGB 14.6 13.0 12.0* 13.0  HCT 43.4 39.5 37.8* 40.3  MCV 85.3 85.7 88.5 88.0  PLT 136* 171 185 244    Basic Metabolic Panel: Recent Labs  Lab 07/27/18 1047 07/31/18 1541 08/01/18 0329 08/02/18 0203  NA 133* 138 139 139  K 4.0 3.9 3.8 3.7  CL 99 103 107 107  CO2 24 24 24 22   GLUCOSE 121* 113* 121* 125*  BUN 18 19 16 18   CREATININE 1.31* 1.22 1.16 0.98  CALCIUM 8.6* 8.8* 8.1* 8.5*  MG  --   --   --  2.3    GFR: Estimated Creatinine Clearance: 119.4 mL/min (by C-G formula based on SCr of 0.98 mg/dL).  Liver Function Tests: Recent Labs  Lab 08/01/18 0329 08/02/18 0203  AST 69* 63*  ALT 57* 60*  ALKPHOS  27* 28*  BILITOT 0.5 0.7  PROT 6.8 7.7  ALBUMIN 3.2* 3.2*    Anemia Panel: Recent Labs    08/01/18 0329 08/02/18 0203  FERRITIN 722* 996*    Recent Results (from the past 240 hour(s))  Novel Coronavirus, NAA (hospital order; send-out to ref lab)     Status: Abnormal   Collection Time: 07/27/18 11:33 AM  Result Value Ref Range Status   SARS-CoV-2, NAA DETECTED (A) NOT DETECTED Final    Comment: Positive (Detected) results are indicative of active infection with SARS CoV 2. A positive result does not rule out bacterial infection or coinfection with other viruses. Detection of SARS CoV 2 viral RNA may not indicate that SARS CoV 2 is the causative agent for clinical symptoms. Positive and negative predictive values of testing are highly dependent on prevalence. False  positive test results are more likely when prevalence is moderate to low. CRITICAL RESULT CALLED TO, READ BACK BY AND VERIFIED WITH: RN Evalee Jefferson [phone] [id number] MLM (NOTE) The expected result is Negative (Not Detected). The SARS CoV 2 test is intended for the presumptive qualitative  detection of nucleic acid from SARS CoV 2 in upper and lower  respiratory specimens. Testing methodology is real time RT PCR. Test results must be correlated with clinical presentation and  evaluated in the context of other laboratory and epidemiologic data.  Test performance can be affected because the epidemiology and  clinica l spectrum of infection caused by SARS CoV 2 is not fully  known. For example, the optimum types of specimens to collect and  when during the course of infection these specimens are most likely  to contain detectable viral RNA may not be known. This test has not been Food and Drug Administration (FDA) cleared or  approved and has been authorized by FDA under an Emergency Use  Authorization (EUA). The test is only authorized for the duration of  the declaration that circumstances exist justifying the authorization  of emergency use of in vitro diagnostic tests for detection and or  diagnosis of SARS CoV 2 under Section 564(b)(1) of the Act, 21 U.S.C.  section [phone] 3(b)(1), unless the authorization is terminated or  revoked sooner. [facility] is certified under the  Clinical Laboratory Improvement Amendments of 1988 (CLIA), 42 U.S.C.  section [phone], to perform high complexity tests. Performed at [facility] CLIA [id number] [address], [address], [place], [place] [address] Laboratory Director: Turner Daniels, MD Performed at [facility], [address], [place], [place] [address]    Coronavirus Source NASOPHARYNGEAL  Final    Comment: Performed at [facility], [address] [place], [place] [address]  MRSA PCR  Screening     Status: None   Collection Time: 08/01/18 10:06 AM  Result Value Ref Range Status   MRSA by PCR NEGATIVE NEGATIVE Final    Comment:        The GeneXpert MRSA Assay (FDA approved for NASAL specimens only), is one component of a comprehensive MRSA colonization surveillance program. It is not intended to diagnose MRSA infection nor to guide or monitor treatment for MRSA infections. Performed at [facility], [address] [place], [place] [address]       Radiology Studies: Dg Chest Port 1 View  Result Date: 08/02/2018 CLINICAL DATA:  Worsening hypoxia. COVID-19 virus infection. EXAM: PORTABLE CHEST 1 VIEW COMPARISON:  07/31/2018 FINDINGS: Heart size is within normal limits. Low  lung volumes are again seen. Multifocal patchy airspace disease in both lungs shows mild worsening since previous study. No evidence of pleural effusion. IMPRESSION: Mild worsening of multifocal bilateral airspace disease. Electronically Signed   By: Myles Rosenthal M.D.   On: 08/02/2018 09:51   Dg Chest Portable 1 View  Result Date: 07/31/2018 CLINICAL DATA:  Increasing shortness of breath and positive Covid-19 diagnosis EXAM: PORTABLE CHEST 1 VIEW COMPARISON:  07/27/2018 FINDINGS: Cardiac shadow is enlarged but stable. Increasing bibasilar infiltrates are noted consistent with the patient's given clinical history. No sizable effusion is seen. No bony abnormality is noted. IMPRESSION: Increasing bibasilar infiltrates. Electronically Signed   By: Alcide Clever M.D.   On: 07/31/2018 15:39     Medications:  Scheduled: . enoxaparin (LOVENOX) injection  40 mg Subcutaneous Daily  . hydroxychloroquine  200 mg Oral BID  . loratadine  10 mg Oral Daily  . sodium chloride flush  3 mL Intravenous Q12H  . tamsulosin  0.4 mg Oral Daily  . vitamin C  1,000 mg Oral Daily   Continuous: . sodium chloride    . dextrose 5 % and 0.45% NaCl 75 mL/hr at 08/02/18 0439  . tocilizumab (ACTEMRA) IV      HFG:BMSXJD chloride, acetaminophen, albuterol, loperamide, sodium chloride flush    Assessment/Plan:  PPE Date: 08/02/2018   Patient Isolation: Airborne+Contact  HCP PPE: Wearing all recommended PPE: N95 mask + eye protection+ Gown + Gloves+ SurgicalCap  Patient PPE: None   Acute respiratory failure with hypoxia secondary to COVID-19 Patient's oxygen requirements have climbed over the last 24 hours.  He is currently on 7 L by high flow nasal cannula.  He his work of breathing has increased as well.  Chest x-ray repeated this morning.  Does mild worsening of the bilateral infiltrates.  Patient's d-dimer has gone up slightly to 0.93.  LDH 358.  Total CK 357.  Ferritin has gone up to 996.  CRP has gone up to 21.6.  LFTs are mildly abnormal but stable.  Electrolytes are stable.  Patient recently completed a course of azithromycin and is not on any other antibacterials currently.  Patient remains on hydroxychloroquine.  QTc interval to be monitored.  It was 420 ms at 7 AM this morning.  Patient is respiratory status appears to be progressively worsening.  Discussed with pulmonology who will consult.  Discussed with pharmacy regarding Actemra.  QuantiFERON test ordered.  Discussed steroids with pulmonology who advises to hold off on steroids for now.  Influenza PCR was negative.  Patient also on vitamin C.  History of groin abscess Apparently present for a month.  Apparently has sinus tracts draining small amount of pus.  No active infection.  WBC is normal.  Local wound care.  Daily sitz bath.  Essential hypertension Monitor blood pressures closely.  Holding valsartan.  History of BPH Stable.  DVT Prophylaxis: Lovenox    Code Status: Full code Family Communication: Discussed with the patient Disposition Plan: Management as outlined above.  Patient remains at high risk for intubation.  Pulmonology to see the patient.    LOS: 2 days   Manning Luna Rito Ehrlich  [facility] Pager on  [email or web address]  08/02/2018, 9:56 AM

## 2018-08-02 NOTE — Consult Note (Signed)
NAME:  Kenneth Briggs, MRN:  [id number], DOB:  Nov 23, 1965, LOS: 2 ADMISSION DATE:  07/31/2018, CONSULTATION DATE:  08/02/18 REFERRING MD:  Osvaldo Shipper, MD CHIEF COMPLAINT:  Hypoxemia in COVID-19  Brief History   53 year old male with morbid obesity and recent diagnosis of COVID-19 pneumonia on 3/30 who is admitted for acute hypoxemic respiratory failure to Hospitalist service on 4/3. Now transferred to step-down unit for worsening hypoxemia requiring 2L to 7L via [place].  History of present illness   53 year old male with morbid obesity and recent diagnosis of COVID-19 pneumonia on 3/30 who is admitted for acute hypoxemic respiratory failure to Hospitalist service on 4/3. He initially presented to urgent care with fever and SOB one week ago. Influenza negative but was treated with tamiflu and azithromcyin without improvement. He was diagnosed with COVID-19 on 3/30 and has self-isolated until he had worsening shortness of breath. He was admitted to [facility] on 4/3 to the Hospitalist service. Today he was transferred to step-down unit for worsening hypoxemia requiring 2L to 7L via .   Past Medical History  Morbid obesity, HTN  Significant Hospital Events   4/3 Admitted 4/4 Transferred to ICU  Consults:  PCCM  Procedures:    Significant Diagnostic Tests:  ABG 0918 7.43/35/65 ABG 1224   Micro Data:  Nasopharyngeal COVID-19 - Positive  Antimicrobials:    Interim history/subjective:  Feels short of breath and weak  Objective   Blood pressure 113/74, pulse 83, temperature 99.5 F (37.5 C), temperature source Oral, resp. rate (!) 22, height 5\' 10"  (1.778 m), weight 124.5 kg, SpO2 94 %.        Intake/Output Summary (Last 24 hours) at 08/02/2018 0929 Last data filed at 08/02/2018 0800 Gross per 24 hour  Intake 2315.62 ml  Output 675 ml  Net 1640.62 ml   Filed Weights   07/31/18 2100 07/31/18 2129  Weight: 124.5 kg 124.5 kg      Resolved Hospital Problem list    Assessment &  Plan:   Moderate ARDS due to COVID-19 Awake proning  >Have patient self-prone in bed for 12 hours during day  >Return to supine position at night  >OK to have brief breaks if needed but goal is to remain proned for as long as possible Obtain ABG prior to returning to supine position Wean supplemental oxygen to maintain saturations 88-95% or pO2 55-80 Completed Toclizumab x 1 dose Start plaquenil. Monitor QTc   Pulmonary will continue to follow  Best practice:  Diet: Regular diet Pain/Anxiety/Delirium protocol (if indicated): No VAP protocol (if indicated): No DVT prophylaxis: Lovenox GI prophylaxis: None Glucose control: per primary Mobility: Prone Code Status: Full Family Communication: Update to position Disposition: ICU  Labs   CBC: Recent Labs  Lab 07/27/18 1047 07/31/18 1541 08/01/18 0329 08/02/18 0203  WBC 4.9 6.9 7.9 10.1  NEUTROABS  --  5.8 6.7 8.4*  HGB 14.6 13.0 12.0* 13.0  HCT 43.4 39.5 37.8* 40.3  MCV 85.3 85.7 88.5 88.0  PLT 136* 171 185 244    Basic Metabolic Panel: Recent Labs  Lab 07/27/18 1047 07/31/18 1541 08/01/18 0329 08/02/18 0203  NA 133* 138 139 139  K 4.0 3.9 3.8 3.7  CL 99 103 107 107  CO2 24 24 24 22   GLUCOSE 121* 113* 121* 125*  BUN 18 19 16 18   CREATININE 1.31* 1.22 1.16 0.98  CALCIUM 8.6* 8.8* 8.1* 8.5*  MG  --   --   --  2.3  GFR: Estimated Creatinine Clearance: 119.4 mL/min (by C-G formula based on SCr of 0.98 mg/dL). Recent Labs  Lab 07/27/18 1047 07/31/18 1541 07/31/18 1542 08/01/18 0329 08/02/18 0203  WBC 4.9 6.9  --  7.9 10.1  LATICACIDVEN  --   --  1.1  --   --     Liver Function Tests: Recent Labs  Lab 08/01/18 0329 08/02/18 0203  AST 69* 63*  ALT 57* 60*  ALKPHOS 27* 28*  BILITOT 0.5 0.7  PROT 6.8 7.7  ALBUMIN 3.2* 3.2*   No results for input(s): LIPASE, AMYLASE in the last 168 hours. No results for input(s): AMMONIA in the last 168 hours.  ABG No results found for: PHART, PCO2ART, PO2ART,  HCO3, TCO2, ACIDBASEDEF, O2SAT   Coagulation Profile: No results for input(s): INR, PROTIME in the last 168 hours.  Cardiac Enzymes: Recent Labs  Lab 08/02/18 0203  CKTOTAL 357    HbA1C: No results found for: HGBA1C  CBG: No results for input(s): GLUCAP in the last 168 hours.  Review of Systems:   Reports shortness of breath, weakness, fever. Denies chest pain  Past Medical History  He,  has a past medical history of Enlarged prostate and Hypertension.   Surgical History    Past Surgical History:  Procedure Laterality Date  . VASECTOMY       Social History   reports that he quit smoking about 25 years ago. He has a 14.00 pack-year smoking history. He has never used smokeless tobacco. He reports previous alcohol use. He reports that he does not use drugs.   Family History   His family history is not on file.   Allergies Allergies  Allergen Reactions  . Lisinopril Cough     Home Medications  Prior to Admission medications   Medication Sig Start Date End Date Taking? Authorizing Provider  benzonatate (TESSALON) 200 MG capsule Take 200 mg by mouth 3 (three) times daily as needed for cough.  07/26/18  Yes [provider]  cetirizine (ZYRTEC) 10 MG tablet Take 10 mg by mouth at bedtime.   Yes [provider]  [employer] D3 50 MCG (2000 UT) CAPS Take 3 capsules by mouth daily.  07/04/18  Yes [provider]  [employer] VITAMIN B12 1000 MCG tablet Take 1,000 mcg by mouth daily.  07/04/18  Yes [provider]  guaiFENesin (MUCINEX) 600 MG 12 hr tablet Take 600 mg by mouth 2 (two) times daily.   Yes [provider]  tamsulosin (FLOMAX) 0.4 MG CAPS capsule Take 0.4 mg by mouth daily.  07/07/18  Yes [provider]  valsartan (DIOVAN) 160 MG tablet Take 160 mg by mouth every evening.  06/11/18  Yes [provider]     Critical care time: N/A    Mechele Collin, M.D. [facility]Critical Care Medicine Pager: [phone]  After hours pager: [phone]

## 2018-08-03 LAB — CBC WITH DIFFERENTIAL/PLATELET
Abs Immature Granulocytes: 0.35 10*3/uL — ABNORMAL HIGH (ref 0.00–0.07)
Basophils Absolute: 0.1 10*3/uL (ref 0.0–0.1)
Basophils Relative: 1 %
Eosinophils Absolute: 0 10*3/uL (ref 0.0–0.5)
Eosinophils Relative: 0 %
HCT: 37.1 % — ABNORMAL LOW (ref 39.0–52.0)
Hemoglobin: 12.2 g/dL — ABNORMAL LOW (ref 13.0–17.0)
Immature Granulocytes: 6 %
Lymphocytes Relative: 20 %
Lymphs Abs: 1.2 10*3/uL (ref 0.7–4.0)
MCH: 28.8 pg (ref 26.0–34.0)
MCHC: 32.9 g/dL (ref 30.0–36.0)
MCV: 87.7 fL (ref 80.0–100.0)
Monocytes Absolute: 0.3 10*3/uL (ref 0.1–1.0)
Monocytes Relative: 5 %
Neutro Abs: 4.1 10*3/uL (ref 1.7–7.7)
Neutrophils Relative %: 68 %
Platelets: 275 10*3/uL (ref 150–400)
RBC: 4.23 MIL/uL (ref 4.22–5.81)
RDW: 13.6 % (ref 11.5–15.5)
WBC: 6.1 10*3/uL (ref 4.0–10.5)
nRBC: 0 % (ref 0.0–0.2)

## 2018-08-03 LAB — COMPREHENSIVE METABOLIC PANEL
ALT: 49 U/L — ABNORMAL HIGH (ref 0–44)
AST: 46 U/L — ABNORMAL HIGH (ref 15–41)
Albumin: 2.7 g/dL — ABNORMAL LOW (ref 3.5–5.0)
Alkaline Phosphatase: 26 U/L — ABNORMAL LOW (ref 38–126)
Anion gap: 8 (ref 5–15)
BUN: 17 mg/dL (ref 6–20)
CO2: 23 mmol/L (ref 22–32)
Calcium: 8.3 mg/dL — ABNORMAL LOW (ref 8.9–10.3)
Chloride: 109 mmol/L (ref 98–111)
Creatinine, Ser: 0.81 mg/dL (ref 0.61–1.24)
GFR calc Af Amer: >60 mL/min (ref >60)
GFR calc non Af Amer: >60 mL/min (ref >60)
Glucose, Bld: 105 mg/dL — ABNORMAL HIGH (ref 70–99)
Potassium: 3.5 mmol/L (ref 3.5–5.1)
Sodium: 140 mmol/L (ref 135–145)
Total Bilirubin: 0.5 mg/dL (ref 0.3–1.2)
Total Protein: 6.4 g/dL — ABNORMAL LOW (ref 6.5–8.1)

## 2018-08-03 LAB — C-REACTIVE PROTEIN: CRP: 13.8 mg/dL — ABNORMAL HIGH (ref <1.0)

## 2018-08-03 LAB — LACTATE DEHYDROGENASE: LDH: 319 U/L — ABNORMAL HIGH (ref 98–192)

## 2018-08-03 LAB — FERRITIN: Ferritin: 1052 ng/mL — ABNORMAL HIGH (ref 24–336)

## 2018-08-03 LAB — D-DIMER, QUANTITATIVE: D-Dimer, Quant: 1 ug/mL-FEU — ABNORMAL HIGH (ref 0.00–0.50)

## 2018-08-03 MED ORDER — FUROSEMIDE 10 MG/ML IJ SOLN
40.0000 mg | Freq: Two times a day (BID) | INTRAMUSCULAR | Status: DC
  Administered 2018-08-03 – 2018-08-04 (×2): 40 mg via INTRAVENOUS
  Filled 2018-08-03 (×2): qty 4

## 2018-08-03 MED ORDER — FUROSEMIDE 10 MG/ML IJ SOLN
40.0000 mg | Freq: Once | INTRAMUSCULAR | Status: AC
  Administered 2018-08-03: 40 mg via INTRAVENOUS

## 2018-08-03 MED ORDER — TOCILIZUMAB 400 MG/20ML IV SOLN
400.0000 mg | Freq: Once | INTRAVENOUS | Status: AC
  Administered 2018-08-03: 400 mg via INTRAVENOUS
  Filled 2018-08-03: qty 20

## 2018-08-03 MED ORDER — POTASSIUM CHLORIDE CRYS ER 20 MEQ PO TBCR
40.0000 meq | EXTENDED_RELEASE_TABLET | Freq: Once | ORAL | Status: AC
  Administered 2018-08-03: 40 meq via ORAL
  Filled 2018-08-03: qty 2

## 2018-08-03 MED ORDER — SALINE SPRAY 0.65 % NA SOLN
2.0000 | NASAL | Status: DC | PRN
  Administered 2018-08-03 – 2018-08-04 (×2): 2 via NASAL
  Filled 2018-08-03: qty 44

## 2018-08-03 NOTE — Progress Notes (Signed)
Pt placed in prone position with pillows guarding all bony prominences. Pt preferred pillows over mepilex dressings. Phone and call bell placed within reach of patient.

## 2018-08-03 NOTE — Progress Notes (Signed)
[facility] PROGRESS NOTE  AMEYA KUTZ ZOX:[id number] DOB: [birth year] DOA: 07/31/2018  PCP: System, Pcp Not In  Brief History/Interval Summary: 53 y.o. male with medical history significant for htn, obesity, who presented with complains of fever and shortness of breath.  Symptoms began about 1 week prior to hospitalization.  Patient was evaluated in urgent care.  COVID-19 test was ordered.  Since he was stable he was asked to self isolate at home.  Has results to come back positive.  Subsequently he developed more shortness of breath and so decided to come into the hospital at the recommendation of the health department.  Patient's oxygen requirements continued to climb.  Patient was started on hydroxychloroquine.  Subsequently transferred to the intensive care unit.  He was also given Actemra.  Reason for Visit: Acute respiratory failure with hypoxia secondary to COVID-19  Consultants: Pulmonology  Procedures: None  Antibiotics: None on the advice of ID.  He completed a course of azithromycin recently.  Subjective/Interval History: Patient states that he is breathing better this morning.  Continues to have a cough which is mostly clear.  Denies any chest pain.  Diarrhea appears to have subsided.  The area in the right groin/perineum is stable.   ROS: No nausea or vomiting.  Objective:  Vital Signs  Vitals:   08/03/18 0500 08/03/18 0600 08/03/18 0700 08/03/18 0800  BP: 110/65 108/62 109/69 106/70  Pulse: 62 66 65 79  Resp: (!) 33 (!) 42 (!) 23 18  Temp:      TempSrc:      SpO2: 95% 92% 91% 95%  Weight:      Height:        Intake/Output Summary (Last 24 hours) at 08/03/2018 0922 Last data filed at 08/03/2018 0611 Gross per 24 hour  Intake 1757.4 ml  Output 1350 ml  Net 407.4 ml   Filed Weights   07/31/18 2100 07/31/18 2129  Weight: 124.5 kg 124.5 kg   General appearance: Awake alert.  In no distress.  Not as fatigued as yesterday. Resp: Tachypneic.  No use of  accessory muscles noted.  Crackles bilaterally.  No wheezing or rhonchi. Cardio: S1-S2 is normal regular.  No S3-S4.  No rubs murmurs or bruit GI: Abdomen is soft.  Nontender nondistended.  Bowel sounds are present normal.  No masses organomegaly Right perineal area shows mild erythema.  Sinus tracts with mild drainage.  All stable according to patient. Extremities: No edema.  Full range of motion of lower extremities. Neurologic: Alert and oriented x3.  No focal neurological deficits.     Lab Results:  Data Reviewed: I have personally reviewed following labs and imaging studies  CBC: Recent Labs  Lab 07/27/18 1047 07/31/18 1541 08/01/18 0329 08/02/18 0203 08/03/18 0341  WBC 4.9 6.9 7.9 10.1 6.1  NEUTROABS  --  5.8 6.7 8.4* 4.1  HGB 14.6 13.0 12.0* 13.0 12.2*  HCT 43.4 39.5 37.8* 40.3 37.1*  MCV 85.3 85.7 88.5 88.0 87.7  PLT 136* 171 185 244 275    Basic Metabolic Panel: Recent Labs  Lab 07/27/18 1047 07/31/18 1541 08/01/18 0329 08/02/18 0203 08/03/18 0341  NA 133* 138 139 139 140  K 4.0 3.9 3.8 3.7 3.5  CL 99 103 107 107 109  CO2 GLUCOSE 121* 113* 121* 125* 105*  BUN CREATININE 1.31* 1.22 1.16 0.98 0.81  CALCIUM 8.6* 8.8* 8.1* 8.5* 8.3*  MG  --   --   --  2.3  --     GFR: Estimated Creatinine Clearance: 144.4 mL/min (by C-G formula based on SCr of 0.81 mg/dL).  Liver Function Tests: Recent Labs  Lab 08/01/18 0329 08/02/18 0203 08/03/18 0341  AST 69* 63* 46*  ALT 57* 60* 49*  ALKPHOS 27* 28* 26*  BILITOT 0.5 0.7 0.5  PROT 6.8 7.7 6.4*  ALBUMIN 3.2* 3.2* 2.7*    Anemia Panel: Recent Labs    08/02/18 0203 08/03/18 0341  FERRITIN 996* 1,052*    Recent Results (from the past 240 hour(s))  Novel Coronavirus, NAA (hospital order; send-out to ref lab)     Status: Abnormal   Collection Time: 07/27/18 11:33 AM  Result Value Ref Range Status   SARS-CoV-2, NAA DETECTED (A) NOT DETECTED Final    Comment: Positive  (Detected) results are indicative of active infection with SARS CoV 2. A positive result does not rule out bacterial infection or coinfection with other viruses. Detection of SARS CoV 2 viral RNA may not indicate that SARS CoV 2 is the causative agent for clinical symptoms. Positive and negative predictive values of testing are highly dependent on prevalence. False positive test results are more likely when prevalence is moderate to low. CRITICAL RESULT CALLED TO, READ BACK BY AND VERIFIED WITH: RN Evalee Jefferson [phone] MLM (NOTE) The expected result is Negative (Not Detected). The SARS CoV 2 test is intended for the presumptive qualitative  detection of nucleic acid from SARS CoV 2 in upper and lower  respiratory specimens. Testing methodology is real time RT PCR. Test results must be correlated with clinical presentation and  evaluated in the context of other laboratory and epidemiologic data.  Test performance can be affected because the epidemiology and  clinica l spectrum of infection caused by SARS CoV 2 is not fully  known. For example, the optimum types of specimens to collect and  when during the course of infection these specimens are most likely  to contain detectable viral RNA may not be known. This test has not been Food and Drug Administration (FDA) cleared or  approved and has been authorized by FDA under an Emergency Use  Authorization (EUA). The test is only authorized for the duration of  the declaration that circumstances exist justifying the authorization  of emergency use of in vitro diagnostic tests for detection and or  diagnosis of SARS CoV 2 under Section 564(b)(1) of the Act, 21 U.S.C.  section [phone] 3(b)(1), unless the authorization is terminated or  revoked sooner. [facility] is certified under the  Clinical Laboratory Improvement Amendments of 1988 (CLIA), 42 U.S.C.  section [phone], to perform high complexity tests. Performed at [facility] CLIA [id number] [address], [address], [place], [place] [address] Laboratory Director: Turner Daniels, MD Performed at [facility], [address], [place], [place] [address]    Coronavirus Source NASOPHARYNGEAL  Final    Comment: Performed at [facility], [address] [place], [place] [address]  MRSA PCR Screening     Status: None   Collection Time: 08/01/18 10:06 AM  Result Value Ref Range Status   MRSA by PCR NEGATIVE NEGATIVE Final    Comment:        The GeneXpert MRSA Assay (FDA approved for NASAL specimens only), is one component of a comprehensive MRSA colonization surveillance program. It is not intended to diagnose MRSA infection nor to guide or monitor treatment for MRSA infections. Performed at  [facility], [address] [place], [place] [address]       Radiology Studies: Dg Chest Port 1 View  Result Date: 08/02/2018 CLINICAL DATA:  Worsening hypoxia. COVID-19 virus infection. EXAM: PORTABLE CHEST 1 VIEW COMPARISON:  07/31/2018 FINDINGS: Heart size is within normal limits. Low lung volumes are again seen. Multifocal patchy airspace disease in both lungs shows mild worsening since previous study. No evidence of pleural effusion. IMPRESSION: Mild worsening of multifocal bilateral airspace disease. Electronically Signed   By: Myles Rosenthal M.D.   On: 08/02/2018 09:51     Medications:  Scheduled: . enoxaparin (LOVENOX) injection  40 mg Subcutaneous Daily  . hydroxychloroquine  200 mg Oral BID  . loratadine  10 mg Oral Daily  . potassium chloride  40 mEq Oral Once  . sodium chloride flush  3 mL Intravenous Q12H  . tamsulosin  0.4 mg Oral Daily  . vitamin C  1,000 mg Oral Daily   Continuous: . sodium chloride    . dextrose 5 % and 0.45% NaCl 75 mL/hr at 08/03/18 0611  . tocilizumab (ACTEMRA) IV     UDJ:SHFWYO chloride, acetaminophen, albuterol, loperamide, sodium chloride flush     Assessment/Plan:  PPE Date: 08/03/2018   Patient Isolation: Airborne+Contact  HCP PPE:  Wearing all recommended PPE: N95 mask + eye protection+ Gown + Gloves+ SurgicalCap  Patient PPE: None   Acute respiratory failure with hypoxia secondary to COVID-19 Patient still requiring high FiO2 in the form of HFNC.patient spent most of the day yesterday in a prone position at the recommendation of pulmonology.  His oxygenation appears to have improved.  He is feeling better.  He has been advised to do the same today as well.  Patient remains on hydroxychloroquine.  Replace potassium.  His QTC is 380 ms this morning.  Patient received a dose of Actemra yesterday.  Discussed with pulmonology this morning.  He might benefit from the second dose as well.  This has been ordered.  QuantiFERON test was sent this morning.  LFTs are slightly better today.  LDH is 319.  Ferritin 1052.  CRP 13.8.  D-dimer 1.0.  No steroids for now.  Influenza PCR was negative.  Patient remains on vitamin C.    History of groin abscess Apparently present for a month.  Apparently has sinus tracts draining small amount of drainage.  Even though there is mild erythema patient states that this is been present for a while.  Local wound care.    Essential hypertension Monitor blood pressures closely.  Holding valsartan.  History of BPH Stable.  DVT Prophylaxis: Lovenox    Code Status: Full code Family Communication: Discussed with the patient.  Discussed with his wife. Disposition Plan: Management as outlined above.  Patient appears to have stabilized but remains tenuous.  Continue monitoring closely.      LOS: 3 days   Catrina Fellenz [employer] on [email or web address]  08/03/2018, 9:22 AM

## 2018-08-03 NOTE — Progress Notes (Addendum)
NAME:  Kenneth Briggs, MRN:  [id number], DOB:  06/01/1965, LOS: 3 ADMISSION DATE:  07/31/2018, CONSULTATION DATE:  08/02/18 REFERRING MD:  Osvaldo Shipper, MD CHIEF COMPLAINT:  Hypoxemia in COVID-19  Brief History   53 year old male with morbid obesity and recent diagnosis of COVID-19 pneumonia on 3/30 who is admitted for acute hypoxemic respiratory failure to Hospitalist service on 4/3. Now transferred to step-down unit for worsening hypoxemia requiring 2L to 7L via [place].  Past Medical History  Morbid obesity, HTN  Significant Hospital Events   4/3 Admitted 4/4 Transferred to ICU 4/5 Received Toclizumab  Consults:  PCCM  Procedures:    Significant Diagnostic Tests:    Micro Data:  Nasopharyngeal COVID-19 - Positive  Antimicrobials:    Interim history/subjective:  Remains on 7 lt o2  Objective   Blood pressure 106/70, pulse 79, temperature (!) 97.5 F (36.4 C), temperature source Oral, resp. rate 18, height 5\' 10"  (1.778 m), weight 124.5 kg, SpO2 95 %.        Intake/Output Summary (Last 24 hours) at 08/03/2018 0855 Last data filed at 08/03/2018 0611 Gross per 24 hour  Intake 1832.35 ml  Output 1350 ml  Net 482.35 ml   Filed Weights   07/31/18 2100 07/31/18 2129  Weight: 124.5 kg 124.5 kg   Gen:     Obese HEENT:  EOMI, sclera anicteric Neck:     No masses; no thyromegaly Lungs:    Clear to auscultation bilaterally; normal respiratory effort CV:         Regular rate and rhythm; no murmurs Abd:      + bowel sounds; soft, non-tender; no palpable masses, no distension Ext:    No edema; adequate peripheral perfusion Skin:      Warm and dry; no rash Neuro: alert and oriented x 3  Resolved Hospital Problem list    Assessment & Plan:  ARDS due to COVID-19 Continue monitoring for intubation needs Pt encouraged to lay in prone position for as long as possible- Awake proning Will discuss with pharmacy about repeat Toclizumab dose today Continue plaquenil. He has already  received a course of azithromycin  Goals of care Had a long discussion with patient who was able to participate fully. He does want intubation if needed but does not want CPR and defib in case of cardiac arrest. His code status had been changed to limited code.   Best practice:  Diet: Regular diet Pain/Anxiety/Delirium protocol (if indicated): No VAP protocol (if indicated): No DVT prophylaxis: Lovenox GI prophylaxis: None Glucose control: per primary Mobility: Prone Code Status: Full Family Communication: Updated Disposition: ICU  Labs   CBC: Recent Labs  Lab 07/27/18 1047 07/31/18 1541 08/01/18 0329 08/02/18 0203 08/03/18 0341  WBC 4.9 6.9 7.9 10.1 6.1  NEUTROABS  --  5.8 6.7 8.4* 4.1  HGB 14.6 13.0 12.0* 13.0 12.2*  HCT 43.4 39.5 37.8* 40.3 37.1*  MCV 85.3 85.7 88.5 88.0 87.7  PLT 136* 171 185 244 275    Basic Metabolic Panel: Recent Labs  Lab 07/27/18 1047 07/31/18 1541 08/01/18 0329 08/02/18 0203 08/03/18 0341  NA 133* 138 139 139 140  K 4.0 3.9 3.8 3.7 3.5  CL 99 103 107 107 109  CO2 24 24 24 22 23   GLUCOSE 121* 113* 121* 125* 105*  BUN 18 19 16 18 17   CREATININE 1.31* 1.22 1.16 0.98 0.81  CALCIUM 8.6* 8.8* 8.1* 8.5* 8.3*  MG  --   --   --  2.3  --    GFR: Estimated Creatinine Clearance: 144.4 mL/min (by C-G formula based on SCr of 0.81 mg/dL). Recent Labs  Lab 07/31/18 1541 07/31/18 1542 08/01/18 0329 08/02/18 0203 08/03/18 0341  WBC 6.9  --  7.9 10.1 6.1  LATICACIDVEN  --  1.1  --   --   --     Liver Function Tests: Recent Labs  Lab 08/01/18 0329 08/02/18 0203 08/03/18 0341  AST 69* 63* 46*  ALT 57* 60* 49*  ALKPHOS 27* 28* 26*  BILITOT 0.5 0.7 0.5  PROT 6.8 7.7 6.4*  ALBUMIN 3.2* 3.2* 2.7*   No results for input(s): LIPASE, AMYLASE in the last 168 hours. No results for input(s): AMMONIA in the last 168 hours.  ABG    Component Value Date/Time   PHART 7.441 08/02/2018 2213   PCO2ART 36.2 08/02/2018 2213   PO2ART 78.5 (L)  08/02/2018 2213   HCO3 24.2 08/02/2018 2213   ACIDBASEDEF 0.9 08/02/2018 1224   O2SAT 96.0 08/02/2018 2213     Coagulation Profile: No results for input(s): INR, PROTIME in the last 168 hours.  Cardiac Enzymes: Recent Labs  Lab 08/02/18 0203  CKTOTAL 357    HbA1C: No results found for: HGBA1C  CBG: No results for input(s): GLUCAP in the last 168 hours.  The patient is critically ill with multiple organ system failure and requires high complexity decision making for assessment and support, frequent evaluation and titration of therapies, advanced monitoring, review of radiographic studies and interpretation of complex data.   Critical Care Time devoted to patient care services, exclusive of separately billable procedures, described in this note is 35 minutes.   Chilton Greathouse MD [facility] Pager [phone] If no answer call [phone] 08/03/2018, 9:02 AM

## 2018-08-04 LAB — COMPREHENSIVE METABOLIC PANEL
ALT: 72 U/L — ABNORMAL HIGH (ref 0–44)
AST: 67 U/L — ABNORMAL HIGH (ref 15–41)
Albumin: 2.8 g/dL — ABNORMAL LOW (ref 3.5–5.0)
Alkaline Phosphatase: 32 U/L — ABNORMAL LOW (ref 38–126)
Anion gap: 9 (ref 5–15)
BUN: 19 mg/dL (ref 6–20)
CO2: 29 mmol/L (ref 22–32)
Calcium: 8.4 mg/dL — ABNORMAL LOW (ref 8.9–10.3)
Chloride: 103 mmol/L (ref 98–111)
Creatinine, Ser: 1.07 mg/dL (ref 0.61–1.24)
GFR calc Af Amer: >60 mL/min (ref >60)
GFR calc non Af Amer: >60 mL/min (ref >60)
Glucose, Bld: 108 mg/dL — ABNORMAL HIGH (ref 70–99)
Potassium: 3.7 mmol/L (ref 3.5–5.1)
Sodium: 141 mmol/L (ref 135–145)
Total Bilirubin: 0.6 mg/dL (ref 0.3–1.2)
Total Protein: 6.7 g/dL (ref 6.5–8.1)

## 2018-08-04 LAB — CBC WITH DIFFERENTIAL/PLATELET
Abs Immature Granulocytes: 0.38 10*3/uL — ABNORMAL HIGH (ref 0.00–0.07)
Basophils Absolute: 0.1 10*3/uL (ref 0.0–0.1)
Basophils Relative: 1 %
Eosinophils Absolute: 0.1 10*3/uL (ref 0.0–0.5)
Eosinophils Relative: 1 %
HCT: 40.9 % (ref 39.0–52.0)
Hemoglobin: 13.2 g/dL (ref 13.0–17.0)
Immature Granulocytes: 6 %
Lymphocytes Relative: 21 %
Lymphs Abs: 1.2 10*3/uL (ref 0.7–4.0)
MCH: 28 pg (ref 26.0–34.0)
MCHC: 32.3 g/dL (ref 30.0–36.0)
MCV: 86.8 fL (ref 80.0–100.0)
Monocytes Absolute: 0.4 10*3/uL (ref 0.1–1.0)
Monocytes Relative: 7 %
Neutro Abs: 3.8 10*3/uL (ref 1.7–7.7)
Neutrophils Relative %: 64 %
Platelets: 326 10*3/uL (ref 150–400)
RBC: 4.71 MIL/uL (ref 4.22–5.81)
RDW: 13.3 % (ref 11.5–15.5)
WBC: 6 10*3/uL (ref 4.0–10.5)
nRBC: 0 % (ref 0.0–0.2)

## 2018-08-04 LAB — C-REACTIVE PROTEIN: CRP: 6.7 mg/dL — ABNORMAL HIGH (ref <1.0)

## 2018-08-04 LAB — LACTATE DEHYDROGENASE: LDH: 353 U/L — ABNORMAL HIGH (ref 98–192)

## 2018-08-04 LAB — FERRITIN: Ferritin: 849 ng/mL — ABNORMAL HIGH (ref 24–336)

## 2018-08-04 MED ORDER — POTASSIUM CHLORIDE CRYS ER 20 MEQ PO TBCR
40.0000 meq | EXTENDED_RELEASE_TABLET | Freq: Once | ORAL | Status: AC
  Administered 2018-08-04: 40 meq via ORAL
  Filled 2018-08-04: qty 2

## 2018-08-04 MED ORDER — ACETAMINOPHEN 325 MG PO TABS: 650.0000 mg | ORAL_TABLET | Freq: Four times a day (QID) | ORAL | Status: DC | PRN

## 2018-08-04 MED ORDER — SODIUM CHLORIDE 0.9 % IV BOLUS
250.0000 mL | Freq: Once | INTRAVENOUS | Status: AC
  Administered 2018-08-04: 250 mL via INTRAVENOUS

## 2018-08-04 MED ORDER — FUROSEMIDE 10 MG/ML IJ SOLN
40.0000 mg | Freq: Every day | INTRAMUSCULAR | Status: DC
  Administered 2018-08-05 – 2018-08-09 (×4): 40 mg via INTRAVENOUS
  Filled 2018-08-04 (×4): qty 4

## 2018-08-04 MED ORDER — CALCIUM CARBONATE ANTACID 500 MG PO CHEW
1.0000 | CHEWABLE_TABLET | Freq: Three times a day (TID) | ORAL | Status: DC | PRN
  Administered 2018-08-04 – 2018-08-05 (×2): 200 mg via ORAL
  Filled 2018-08-04 (×2): qty 1

## 2018-08-04 NOTE — Progress Notes (Signed)
[facility] PROGRESS NOTE  Arvin CollardDavid S Levingston ZOX:[id number] DOB: 06/13/1965 DOA: 07/31/2018  PCP: System, Pcp Not In  Brief History/Interval Summary: 53 y.o. male with medical history significant for htn, obesity, who presented with complains of fever and shortness of breath.  Symptoms began about 1 week prior to hospitalization.  Patient was evaluated in urgent care.  COVID-19 test was ordered.  Since he was stable he was asked to self isolate at home.  Has results to come back positive.  Subsequently he developed more shortness of breath and so decided to come into the hospital at the recommendation of the health department.  Patient's oxygen requirements continued to climb.  Patient was started on hydroxychloroquine.  Subsequently transferred to the intensive care unit.  He was also given Actemra.  Reason for Visit: Acute respiratory failure with hypoxia secondary to COVID-19  Consultants: Pulmonology  Procedures: None  Antibiotics: He completed a course of azithromycin recently.  Subjective/Interval History: Patient was placed on nonrebreather mainly for comfort.  Patient states that he continues to have shortness of breath but better than yesterday.  Continues to have a cough.  Denies any chest pain.  Diarrhea has subsided.      ROS: No nausea or vomiting  Objective:  Vital Signs  Vitals:   08/04/18 0600 08/04/18 0700 08/04/18 0900 08/04/18 0938  BP: 99/68 129/71    Pulse: 70 73  68  Resp: (!) 28 (!) 27  (!) 25  Temp:   97.9 F (36.6 C)   TempSrc:   Oral   SpO2: 94% 93%  97%  Weight:      Height:        Intake/Output Summary (Last 24 hours) at 08/04/2018 1023 Last data filed at 08/04/2018 [id number] Gross per 24 hour  Intake 1596.53 ml  Output 5200 ml  Net -3603.47 ml   Filed Weights   07/31/18 2100 07/31/18 2129  Weight: 124.5 kg 124.5 kg    General appearance: Awake alert.  In no distress Resp: Tachypneic at rest.  Coarse breath sounds bilaterally.  Diminished  at the bases with few crackles.  No wheezing or rhonchi.   Cardio: S1-S2 is normal regular.  No S3-S4.  No rubs murmurs or bruit GI: Abdomen is soft.  Nontender nondistended.  Bowel sounds are present normal.  No masses organomegaly Right perineal area shows mild erythema.  Sinus tracts with mild drainage.  All stable according to patient. Extremities: No edema.  Full range of motion of lower extremities. Neurologic: Alert and oriented x3.  No focal neurological deficits.      Lab Results:  Data Reviewed: I have personally reviewed following labs and imaging studies  CBC: Recent Labs  Lab 07/31/18 1541 08/01/18 0329 08/02/18 0203 08/03/18 0341 08/04/18 0301  WBC 6.9 7.9 10.1 6.1 6.0  NEUTROABS 5.8 6.7 8.4* 4.1 3.8  HGB 13.0 12.0* 13.0 12.2* 13.2  HCT 39.5 37.8* 40.3 37.1* 40.9  MCV 85.7 88.5 88.0 87.7 86.8  PLT 171 185 244 275 326    Basic Metabolic Panel: Recent Labs  Lab 07/31/18 1541 08/01/18 0329 08/02/18 0203 08/03/18 0341 08/04/18 0301  NA 138 139 139 140 141  K 3.9 3.8 3.7 3.5 3.7  CL 103 107 107 109 103  CO2 24 24 22 23 29   GLUCOSE 113* 121* 125* 105* 108*  BUN 19 16 18 17 19   CREATININE 1.22 1.16 0.98 0.81 1.07  CALCIUM 8.8* 8.1* 8.5* 8.3* 8.4*  MG  --   --  2.3  --   --     GFR: Estimated Creatinine Clearance: 109.3 mL/min (by C-G formula based on SCr of 1.07 mg/dL).  Liver Function Tests: Recent Labs  Lab 08/01/18 0329 08/02/18 0203 08/03/18 0341 08/04/18 0301  AST 69* 63* 46* 67*  ALT 57* 60* 49* 72*  ALKPHOS 27* 28* 26* 32*  BILITOT 0.5 0.7 0.5 0.6  PROT 6.8 7.7 6.4* 6.7  ALBUMIN 3.2* 3.2* 2.7* 2.8*    Anemia Panel: Recent Labs    08/03/18 0341 08/04/18 0301  FERRITIN 1,052* 849*    Recent Results (from the past 240 hour(s))  Novel Coronavirus, NAA (hospital order; send-out to ref lab)     Status: Abnormal   Collection Time: 07/27/18 11:33 AM  Result Value Ref Range Status   SARS-CoV-2, NAA DETECTED (A) NOT DETECTED Final     Comment: Positive (Detected) results are indicative of active infection with SARS CoV 2. A positive result does not rule out bacterial infection or coinfection with other viruses. Detection of SARS CoV 2 viral RNA may not indicate that SARS CoV 2 is the causative agent for clinical symptoms. Positive and negative predictive values of testing are highly dependent on prevalence. False positive test results are more likely when prevalence is moderate to low. CRITICAL RESULT CALLED TO, READ BACK BY AND VERIFIED WITH: RN Evalee Jefferson [phone] MLM (NOTE) The expected result is Negative (Not Detected). The SARS CoV 2 test is intended for the presumptive qualitative  detection of nucleic acid from SARS CoV 2 in upper and lower  respiratory specimens. Testing methodology is real time RT PCR. Test results must be correlated with clinical presentation and  evaluated in the context of other laboratory and epidemiologic data.  Test performance can be affected because the epidemiology and  clinica l spectrum of infection caused by SARS CoV 2 is not fully  known. For example, the optimum types of specimens to collect and  when during the course of infection these specimens are most likely  to contain detectable viral RNA may not be known. This test has not been Food and Drug Administration (FDA) cleared or  approved and has been authorized by FDA under an Emergency Use  Authorization (EUA). The test is only authorized for the duration of  the declaration that circumstances exist justifying the authorization  of emergency use of in vitro diagnostic tests for detection and or  diagnosis of SARS CoV 2 under Section 564(b)(1) of the Act, 21 U.S.C.  section [phone] 3(b)(1), unless the authorization is terminated or  revoked sooner. [facility] is certified under the  Clinical Laboratory Improvement Amendments of 1988 (CLIA), 42 U.S.C.  section [phone], to perform high complexity tests. Performed at  [facility] CLIA [id number] [address], [address], [place], [place] [address] Laboratory Director: Turner Daniels, MD Performed at [facility], [address], [place], [place] [address]    Coronavirus Source NASOPHARYNGEAL  Final    Comment: Performed at [facility], [address] [place], [place] [address]  MRSA PCR Screening     Status: None   Collection Time: 08/01/18 10:06 AM  Result Value Ref Range Status   MRSA by PCR NEGATIVE NEGATIVE Final    Comment:        The GeneXpert MRSA Assay (FDA approved for NASAL specimens only), is one component of a comprehensive MRSA colonization surveillance program. It is not intended to diagnose MRSA infection  nor to guide or monitor treatment for MRSA infections. Performed at [facility], [address], [place], [place] [address]       Radiology Studies: No results found.   Medications:  Scheduled: . enoxaparin (LOVENOX) injection  40 mg Subcutaneous Daily  . furosemide  40 mg Intravenous BID  . hydroxychloroquine  200 mg Oral BID  . loratadine  10 mg Oral Daily  . sodium chloride flush  3 mL Intravenous Q12H  . tamsulosin  0.4 mg Oral Daily  . vitamin C  1,000 mg Oral Daily   Continuous: . sodium chloride    . dextrose 5 % and 0.45% NaCl 10 mL/hr at 08/04/18 0801   UEA:VWUJWJ chloride, acetaminophen, albuterol, loperamide, sodium chloride, sodium chloride flush    Assessment/Plan:  PPE Date: 08/04/2018   Patient Isolation: Airborne+Contact  HCP PPE:  Wearing all recommended PPE: N95 mask + surgical mask + eye protection+ Gown + Gloves+ SurgicalCap  Patient PPE: None   Acute respiratory failure with hypoxia secondary to COVID-19 Patient was placed on partial rebreather yesterday.  Patient states that he feels better.  Seems to be stable compared to yesterday.  Still requiring a lot of oxygen.  Nurse to start weaning down today.  Patient  to be again placed in a prone position today.  Pulmonology is following.  Patient remains on hydroxychloroquine.  QTc is 420 ms this morning.  Potassium 3.7.  We will give an additional dose today.  Patient has received 2 doses of Actemra.  Patient and his family notified that these are not proven treatments for COVID-19.  These have been given based on anecdotal data.  QuantiFERON result is pending.  LFTs are stable for the most part.  LDH 353.  Ferritin 849.  CRP improved to 6.7.   D-dimer 1.0.  No steroids for now.  Influenza PCR was negative.  Patient remains on vitamin C.  Placed on furosemide by pulmonology.  We will cut back on his IV fluids.  History of groin abscess Apparently present for a month.  Apparently has sinus tracts draining small amount of drainage.  Even though there is mild erythema patient states that this is been present for a while.  Local wound care.  No changes per patient.  Essential hypertension Blood pressure noted to be soft.  Holding valsartan.    History of BPH Stable.  DVT Prophylaxis: Lovenox    Code Status: Full code Family Communication: Discussed with the patient.  Discussed with his wife. Disposition Plan: Management as outlined above.  Patient's respiratory status remains tenuous.  Continue prone position as much as possible.    LOS: 4 days   Sylver Vantassell [employer] on [email or web address]  08/04/2018, 10:23 AM

## 2018-08-04 NOTE — Plan of Care (Signed)
  Problem: Clinical Measurements: Goal: Will remain free from infection Outcome: Progressing   Problem: Clinical Measurements: Goal: Cardiovascular complication will be avoided Outcome: Progressing   Problem: Clinical Measurements: Goal: Respiratory complications will improve Outcome: Progressing   Problem: Activity: Goal: Risk for activity intolerance will decrease Outcome: Progressing   Problem: Nutrition: Goal: Adequate nutrition will be maintained Outcome: Progressing   Problem: Coping: Goal: Level of anxiety will decrease Outcome: Progressing

## 2018-08-04 NOTE — Progress Notes (Signed)
NAME:  Kenneth Briggs, MRN:  [id number], DOB:  10-14-1965, LOS: 4 ADMISSION DATE:  07/31/2018, CONSULTATION DATE:  08/02/18 REFERRING MD:  Osvaldo Shipper, MD CHIEF COMPLAINT:  Hypoxemia in COVID-19  Brief History   53 year old male with morbid obesity and recent diagnosis of COVID-19 pneumonia on 3/30 who is admitted for acute hypoxemic respiratory failure to Hospitalist service on 4/3. Now transferred to step-down unit for worsening hypoxemia requiring 2L to 7L via [place].  Past Medical History  Morbid obesity, HTN  Significant Hospital Events   4/3 Admitted 4/4 Transferred to ICU 4/5-4/6 Received Toclizumab x 2 doses  Consults:  PCCM  Procedures:    Significant Diagnostic Tests:    Micro Data:  Nasopharyngeal COVID-19 - Positive  Antimicrobials:    Interim history/subjective:  Remains on 7 lt O2. Stable resp status  Objective   Blood pressure (!) 86/58, pulse 75, temperature 97.9 F (36.6 C), temperature source Oral, resp. rate (!) 22, height 5\' 10"  (1.778 m), weight 124.5 kg, SpO2 97 %.        Intake/Output Summary (Last 24 hours) at 08/04/2018 1034 Last data filed at 08/04/2018 1025 Gross per 24 hour  Intake 2076.53 ml  Output 6400 ml  Net -4323.47 ml   Filed Weights   07/31/18 2100 07/31/18 2129  Weight: 124.5 kg 124.5 kg   Gen:     Obese HEENT:  EOMI, sclera anicteric Neck:     No masses; no thyromegaly Lungs:    Clear to auscultation bilaterally; normal respiratory effort CV:         Regular rate and rhythm; no murmurs Abd:      + bowel sounds; soft, non-tender; no palpable masses, no distension Ext:    No edema; adequate peripheral perfusion Skin:      Warm and dry; no rash Neuro: alert and oriented x 3  Resolved Hospital Problem list    Assessment & Plan:  ARDS due to COVID-19 Continue monitoring for intubation needs Pt encouraged to lay in prone position for as long as possible- Awake proning Got 2 doses of IL6 inhibitor Continue plaquenil. He has  already received a course of azithromycin Continue lasix for diuresis. Will reduce dose to 40 mg/day today due to soft BP  Goals of care Had a long discussion with patient who was able to participate fully on 4/6. He does want intubation if needed but does not want CPR and defib in case of cardiac arrest. His code status had been changed to limited code.   Best practice:  Diet: Regular diet Pain/Anxiety/Delirium protocol (if indicated): No VAP protocol (if indicated): No DVT prophylaxis: Lovenox GI prophylaxis: None Glucose control: per primary Mobility: Prone Code Status: Limited code Family Communication: Updated Disposition: ICU  Labs   CBC: Recent Labs  Lab 07/31/18 1541 08/01/18 0329 08/02/18 0203 08/03/18 0341 08/04/18 0301  WBC 6.9 7.9 10.1 6.1 6.0  NEUTROABS 5.8 6.7 8.4* 4.1 3.8  HGB 13.0 12.0* 13.0 12.2* 13.2  HCT 39.5 37.8* 40.3 37.1* 40.9  MCV 85.7 88.5 88.0 87.7 86.8  PLT 171 185 244 275 326    Basic Metabolic Panel: Recent Labs  Lab 07/31/18 1541 08/01/18 0329 08/02/18 0203 08/03/18 0341 08/04/18 0301  NA 138 139 139 140 141  K 3.9 3.8 3.7 3.5 3.7  CL 103 107 107 109 103  CO2 24 24 22 23 29   GLUCOSE 113* 121* 125* 105* 108*  BUN 19 16 18 17 19   CREATININE 1.22 1.16 0.98  0.81 1.07  CALCIUM 8.8* 8.1* 8.5* 8.3* 8.4*  MG  --   --  2.3  --   --    GFR: Estimated Creatinine Clearance: 109.3 mL/min (by C-G formula based on SCr of 1.07 mg/dL). Recent Labs  Lab 07/31/18 1542 08/01/18 0329 08/02/18 0203 08/03/18 0341 08/04/18 0301  WBC  --  7.9 10.1 6.1 6.0  LATICACIDVEN 1.1  --   --   --   --     Liver Function Tests: Recent Labs  Lab 08/01/18 0329 08/02/18 0203 08/03/18 0341 08/04/18 0301  AST 69* 63* 46* 67*  ALT 57* 60* 49* 72*  ALKPHOS 27* 28* 26* 32*  BILITOT 0.5 0.7 0.5 0.6  PROT 6.8 7.7 6.4* 6.7  ALBUMIN 3.2* 3.2* 2.7* 2.8*   No results for input(s): LIPASE, AMYLASE in the last 168 hours. No results for input(s): AMMONIA in  the last 168 hours.  ABG    Component Value Date/Time   PHART 7.441 08/02/2018 2213   PCO2ART 36.2 08/02/2018 2213   PO2ART 78.5 (L) 08/02/2018 2213   HCO3 24.2 08/02/2018 2213   ACIDBASEDEF 0.9 08/02/2018 1224   O2SAT 96.0 08/02/2018 2213     Coagulation Profile: No results for input(s): INR, PROTIME in the last 168 hours.  Cardiac Enzymes: Recent Labs  Lab 08/02/18 0203  CKTOTAL 357    HbA1C: No results found for: HGBA1C  CBG: No results for input(s): GLUCAP in the last 168 hours.  The patient is critically ill with multiple organ system failure and requires high complexity decision making for assessment and support, frequent evaluation and titration of therapies, advanced monitoring, review of radiographic studies and interpretation of complex data.   Critical Care Time devoted to patient care services, exclusive of separately billable procedures, described in this note is 35 minutes.   Chilton GreathousePraveen Saquan Furtick MD [facility] Pager [phone] If no answer call [phone] 08/04/2018, 10:34 AM

## 2018-08-04 NOTE — Progress Notes (Signed)
Agree with [facility] assessment. Will continue to monitor.

## 2018-08-05 DIAGNOSIS — N400 Enlarged prostate without lower urinary tract symptoms: Secondary | ICD-10-CM

## 2018-08-05 DIAGNOSIS — R0902 Hypoxemia: Secondary | ICD-10-CM

## 2018-08-05 LAB — CBC WITH DIFFERENTIAL/PLATELET
Abs Immature Granulocytes: 0.35 10*3/uL — ABNORMAL HIGH (ref 0.00–0.07)
Basophils Absolute: 0.1 10*3/uL (ref 0.0–0.1)
Basophils Relative: 1 %
Eosinophils Absolute: 0.1 10*3/uL (ref 0.0–0.5)
Eosinophils Relative: 2 %
HCT: 40.6 % (ref 39.0–52.0)
Hemoglobin: 13.6 g/dL (ref 13.0–17.0)
Immature Granulocytes: 6 %
Lymphocytes Relative: 18 %
Lymphs Abs: 1.1 10*3/uL (ref 0.7–4.0)
MCH: 28.5 pg (ref 26.0–34.0)
MCHC: 33.5 g/dL (ref 30.0–36.0)
MCV: 85.1 fL (ref 80.0–100.0)
Monocytes Absolute: 0.5 10*3/uL (ref 0.1–1.0)
Monocytes Relative: 8 %
Neutro Abs: 4.1 10*3/uL (ref 1.7–7.7)
Neutrophils Relative %: 65 %
Platelets: 370 10*3/uL (ref 150–400)
RBC: 4.77 MIL/uL (ref 4.22–5.81)
RDW: 12.9 % (ref 11.5–15.5)
WBC: 6.2 10*3/uL (ref 4.0–10.5)
nRBC: 0 % (ref 0.0–0.2)

## 2018-08-05 LAB — COMPREHENSIVE METABOLIC PANEL
ALT: 152 U/L — ABNORMAL HIGH (ref 0–44)
AST: 128 U/L — ABNORMAL HIGH (ref 15–41)
Albumin: 3.2 g/dL — ABNORMAL LOW (ref 3.5–5.0)
Alkaline Phosphatase: 36 U/L — ABNORMAL LOW (ref 38–126)
Anion gap: 10 (ref 5–15)
BUN: 26 mg/dL — ABNORMAL HIGH (ref 6–20)
CO2: 28 mmol/L (ref 22–32)
Calcium: 8.9 mg/dL (ref 8.9–10.3)
Chloride: 103 mmol/L (ref 98–111)
Creatinine, Ser: 0.96 mg/dL (ref 0.61–1.24)
GFR calc Af Amer: >60 mL/min (ref >60)
GFR calc non Af Amer: >60 mL/min (ref >60)
Glucose, Bld: 116 mg/dL — ABNORMAL HIGH (ref 70–99)
Potassium: 3.4 mmol/L — ABNORMAL LOW (ref 3.5–5.1)
Sodium: 141 mmol/L (ref 135–145)
Total Bilirubin: 0.9 mg/dL (ref 0.3–1.2)
Total Protein: 6.9 g/dL (ref 6.5–8.1)

## 2018-08-05 LAB — QUANTIFERON-TB GOLD PLUS: QuantiFERON-TB Gold Plus: UNDETERMINED

## 2018-08-05 LAB — QUANTIFERON-TB GOLD PLUS (RQFGPL)
QuantiFERON Mitogen Value: 0.11 IU/mL
QuantiFERON Nil Value: 0.02 IU/mL
QuantiFERON TB1 Ag Value: 0.02 IU/mL
QuantiFERON TB2 Ag Value: 0.02 IU/mL

## 2018-08-05 LAB — D-DIMER, QUANTITATIVE: D-Dimer, Quant: 0.86 ug/mL-FEU — ABNORMAL HIGH (ref 0.00–0.50)

## 2018-08-05 LAB — FERRITIN: Ferritin: 937 ng/mL — ABNORMAL HIGH (ref 24–336)

## 2018-08-05 LAB — C-REACTIVE PROTEIN: CRP: 3.5 mg/dL — ABNORMAL HIGH (ref <1.0)

## 2018-08-05 LAB — LACTATE DEHYDROGENASE: LDH: 368 U/L — ABNORMAL HIGH (ref 98–192)

## 2018-08-05 MED ORDER — TRAMADOL HCL 50 MG PO TABS
50.0000 mg | ORAL_TABLET | Freq: Four times a day (QID) | ORAL | Status: DC | PRN
  Administered 2018-08-05 – 2018-08-07 (×7): 50 mg via ORAL
  Filled 2018-08-05 (×8): qty 1

## 2018-08-05 MED ORDER — POTASSIUM CHLORIDE CRYS ER 20 MEQ PO TBCR
60.0000 meq | EXTENDED_RELEASE_TABLET | Freq: Once | ORAL | Status: AC
  Administered 2018-08-05: 60 meq via ORAL
  Filled 2018-08-05: qty 3

## 2018-08-05 MED ORDER — CYCLOBENZAPRINE HCL 10 MG PO TABS
10.0000 mg | ORAL_TABLET | Freq: Three times a day (TID) | ORAL | Status: DC | PRN
  Administered 2018-08-05 – 2018-08-07 (×6): 10 mg via ORAL
  Filled 2018-08-05 (×6): qty 1

## 2018-08-05 MED ORDER — ENOXAPARIN SODIUM 60 MG/0.6ML ~~LOC~~ SOLN
60.0000 mg | Freq: Every day | SUBCUTANEOUS | Status: DC
  Administered 2018-08-06 – 2018-08-09 (×4): 60 mg via SUBCUTANEOUS
  Filled 2018-08-05 (×5): qty 0.6

## 2018-08-05 MED ORDER — OXYCODONE-ACETAMINOPHEN 5-325 MG PO TABS: 2.0000 | ORAL_TABLET | Freq: Once | ORAL | Status: DC

## 2018-08-05 NOTE — Progress Notes (Addendum)
Patient c/o back pain when lying prone; therefore, is not lying prone for very long. Paged K. Schorr. New order for PO ultram and flexeril. Will continue to monitor.   Patient slept prone approximately 5 hours. Will continue to monitor.

## 2018-08-05 NOTE — Progress Notes (Addendum)
NAME:  Kenneth Briggs, MRN:  [id number], DOB:  March 11, 1966, LOS: 5 ADMISSION DATE:  07/31/2018, CONSULTATION DATE:  08/02/18 REFERRING MD:  Osvaldo Shipper, MD CHIEF COMPLAINT:  Hypoxemia in COVID-19  Brief History   53 year old male with morbid obesity and recent diagnosis of COVID-19 pneumonia on 3/30 who is admitted for acute hypoxemic respiratory failure to Hospitalist service on 4/3. Now transferred to step-down unit for worsening hypoxemia requiring 2L to 7L via [place].  Past Medical History  Morbid obesity, HTN  Significant Hospital Events   4/3 Admitted 4/4 Transferred to ICU 4/5-4/6 Received Tocilizumab x 2 doses   Consults:  PCCM  Procedures:    Significant Diagnostic Tests:    Micro Data:  Nasopharyngeal COVID-19 - Positive  Antimicrobials:    Interim history/subjective:  O2 requirements slightly better. Now on 6.5 lt HFNC  Objective   Blood pressure 116/73, pulse 71, temperature 97.8 F (36.6 C), temperature source Oral, resp. rate (!) 26, height 5\' 10"  (1.778 m), weight 119.4 kg, SpO2 92 %.        Intake/Output Summary (Last 24 hours) at 08/05/2018 0932 Last data filed at 08/05/2018 0115 Gross per 24 hour  Intake 1118.75 ml  Output 1650 ml  Net -531.25 ml   Filed Weights   07/31/18 2100 07/31/18 2129 08/05/18 0300  Weight: 124.5 kg 124.5 kg 119.4 kg   Gen:      No acute distress HEENT:  EOMI, sclera anicteric Neck:     No masses; no thyromegaly Lungs:    Clear to auscultation bilaterally; normal respiratory effort CV:         Regular rate and rhythm; no murmurs Abd:      + bowel sounds; soft, non-tender; no palpable masses, no distension Ext:    No edema; adequate peripheral perfusion Skin:      Warm and dry; no rash Neuro: alert and oriented x 3 Psych: normal mood and affect  Resolved Hospital Problem list    Assessment & Plan:  ARDS due to COVID-19 Continue monitoring for intubation needs Pt encouraged to lay in prone position for as long as  possible- Awake proning Got 2 doses of IL6 inhibitor Continue plaquenil. He has already received a course of azithromycin Continue lasix for diuresis.  Goals of care Had a long discussion with patient who was able to participate fully on 4/6. He does want intubation if needed but does not want CPR and defib in case of cardiac arrest. His code status had been changed to limited code.   Best practice:  Diet: Regular diet Pain/Anxiety/Delirium protocol (if indicated): No VAP protocol (if indicated): No DVT prophylaxis: Lovenox GI prophylaxis: None Glucose control: per primary Mobility: Prone Code Status: Limited code Family Communication: Updated Disposition: ICU  Labs   CBC: Recent Labs  Lab 08/01/18 0329 08/02/18 0203 08/03/18 0341 08/04/18 0301 08/05/18 0321  WBC 7.9 10.1 6.1 6.0 6.2  NEUTROABS 6.7 8.4* 4.1 3.8 4.1  HGB 12.0* 13.0 12.2* 13.2 13.6  HCT 37.8* 40.3 37.1* 40.9 40.6  MCV 88.5 88.0 87.7 86.8 85.1  PLT 185 244 275 326 370    Basic Metabolic Panel: Recent Labs  Lab 08/01/18 0329 08/02/18 0203 08/03/18 0341 08/04/18 0301 08/05/18 0321  NA 139 139 140 141 141  K 3.8 3.7 3.5 3.7 3.4*  CL 107 107 109 103 103  CO2 24 22 23 29 28   GLUCOSE 121* 125* 105* 108* 116*  BUN 16 18 17 19  26*  CREATININE 1.16  0.98 0.81 1.07 0.96  CALCIUM 8.1* 8.5* 8.3* 8.4* 8.9  MG  --  2.3  --   --   --    GFR: Estimated Creatinine Clearance: 119.3 mL/min (by C-G formula based on SCr of 0.96 mg/dL). Recent Labs  Lab 07/31/18 1542  08/02/18 0203 08/03/18 0341 08/04/18 0301 08/05/18 0321  WBC  --    < > 10.1 6.1 6.0 6.2  LATICACIDVEN 1.1  --   --   --   --   --    < > = values in this interval not displayed.    Liver Function Tests: Recent Labs  Lab 08/01/18 0329 08/02/18 0203 08/03/18 0341 08/04/18 0301 08/05/18 0321  AST 69* 63* 46* 67* 128*  ALT 57* 60* 49* 72* 152*  ALKPHOS 27* 28* 26* 32* 36*  BILITOT 0.5 0.7 0.5 0.6 0.9  PROT 6.8 7.7 6.4* 6.7 6.9   ALBUMIN 3.2* 3.2* 2.7* 2.8* 3.2*   No results for input(s): LIPASE, AMYLASE in the last 168 hours. No results for input(s): AMMONIA in the last 168 hours.  ABG    Component Value Date/Time   PHART 7.441 08/02/2018 2213   PCO2ART 36.2 08/02/2018 2213   PO2ART 78.5 (L) 08/02/2018 2213   HCO3 24.2 08/02/2018 2213   ACIDBASEDEF 0.9 08/02/2018 1224   O2SAT 96.0 08/02/2018 2213     Coagulation Profile: No results for input(s): INR, PROTIME in the last 168 hours.  Cardiac Enzymes: Recent Labs  Lab 08/02/18 0203  CKTOTAL 357    HbA1C: No results found for: HGBA1C  CBG: No results for input(s): GLUCAP in the last 168 hours.  Chilton GreathousePraveen Monta Maiorana MD [facility] 08/05/2018, 9:34 AM

## 2018-08-05 NOTE — Progress Notes (Signed)
PROGRESS NOTE                                                                                                                                                                                                             Patient Demographics:    Kenneth Briggs, is a 53 y.o. male, DOB - 09/26/1965, ZOX:[id number]  Admit date - 07/31/2018   Admitting Physician Kathrynn RunningNoah Bedford Wouk, MD  Outpatient Primary MD for the patient is System, Pcp Not In  LOS - 5   No chief complaint on file.      Brief Narrative    52 y.o.malewith medical history significant forhtn, obesity, who presented with complains of fever and shortness of breath.  Symptoms began about 1 week prior to hospitalization.  Patient was evaluated in urgent care.  COVID-19 test was ordered.  Since he was stable he was asked to self isolate at home.  Has results to come back positive.  Subsequently he developed more shortness of breath and so decided to come into the hospital at the recommendation of the health department.  Patient's oxygen requirements continued to climb.  Patient was started on hydroxychloroquine.  Subsequently transferred to the intensive care unit.  He was also given Actemra   Subjective:    Kenneth Massonavid Briggs today reports minimal cough, nonproductive, dyspnea has improved, no chest pain, fever or chills    Assessment  & Plan :    Principal Problem:   COVID-19 virus infection Active Problems:   Essential hypertension   Obesity   BPH (benign prostatic hyperplasia)   Abscess of groin, right   Acute respiratory disease due to COVID-19 virus   Acute respiratory failure with hypoxia secondary to COVID-19 -Patient had increased oxygen requirement, he was transferred to ICU for 08/17/2018, he was on for liters high flow nasal cannula yesterday, he is on 4 L high flow nasal cannula this morning , appears to be more comfortable . -Continue with Plaquenil, QTC this morning 390 .   Continue to monitor potassium and replete as needed -PCCM consult greatly appreciated, he did receive Actemra (follow on QuantiFERON ).continue to monitor LFTs closely . -Likely increased, ferritin with no significant change, CRP is trending down. -He remains on 4 L high flow nasal cannula, wean as tolerated, patient encouraged to continue with awake proning. -Continue with IV Lasix, monitor  renal function and blood pressure closely.  History of groin abscess Apparently present for a month.  Apparently has sinus tracts draining small amount of drainage.  Even though there is mild erythema patient states that this is been present for a while.  Local wound care.  No changes per patient.  Transaminitis -LFTs increased today, he did receive Actemra  Essential hypertension Blood pressure noted to be soft.  Holding valsartan.    History of BPH Stable.  Hypokalemia - Repleted    Code Status : Partial  Family Communication  : D/W wife via phone  Disposition Plan  : remains in ICU.  Consults  :  PCCM  Procedures  : None  DVT Prophylaxis  :  [place] lovenox  Lab Results  Component Value Date   PLT 370 08/05/2018    Antibiotics  :    Anti-infectives (From admission, onward)   Start     Dose/Rate Route Frequency Ordered Stop   08/03/18 2200  hydroxychloroquine (PLAQUENIL) tablet 200 mg  Status:  Discontinued     200 mg Oral 2 times daily 08/02/18 1738 08/02/18 1744   08/02/18 2200  hydroxychloroquine (PLAQUENIL) tablet 400 mg  Status:  Discontinued     400 mg Oral 2 times daily 08/02/18 1738 08/02/18 1744   08/01/18 2200  hydroxychloroquine (PLAQUENIL) tablet 200 mg     200 mg Oral 2 times daily 07/31/18 2213 08/05/18 0809   07/31/18 2215  hydroxychloroquine (PLAQUENIL) tablet 400 mg     400 mg Oral 2 times daily 07/31/18 2213 08/01/18 0958        Objective:   Vitals:   08/05/18 0600 08/05/18 0800 08/05/18 0900 08/05/18 1000  BP: 115/67 124/72 116/73 (!) 98/54  Pulse:  (!) 53 (!) 54 71 82  Resp:  19 (!) 26 (!) 29  Temp:  97.8 F (36.6 C)    TempSrc:  Oral    SpO2: 94% 95% 92% 94%  Weight:      Height:        Wt Readings from Last 3 Encounters:  08/05/18 119.4 kg  07/31/18 127 kg  07/27/18 127 kg     Intake/Output Summary (Last 24 hours) at 08/05/2018 1107 Last data filed at 08/05/2018 1001 Gross per 24 hour  Intake 938.75 ml  Output 1850 ml  Net -911.25 ml     Physical Exam  Awake Alert, Oriented X 3, No new F.N deficits, Normal affect.  Symmetrical Chest wall movement, diminished air entry at the bases, tachypneic RRR,No Gallops,Rubs or new Murmurs, No Parasternal Heave +ve B.Sounds, Abd Soft, No tenderness, No rebound - guarding or rigidity. No Cyanosis, Clubbing or edema, No new Rash or bruise   PPE:  -Patient isolation: Airborne + contact - wearing all recommended PPE: CAPR, gloves, surgicalcap.    Data Review:    CBC Recent Labs  Lab 08/01/18 0329 08/02/18 0203 08/03/18 0341 08/04/18 0301 08/05/18 0321  WBC 7.9 10.1 6.1 6.0 6.2  HGB 12.0* 13.0 12.2* 13.2 13.6  HCT 37.8* 40.3 37.1* 40.9 40.6  PLT 185 244 275 326 370  MCV 88.5 88.0 87.7 86.8 85.1  MCH 28.1 28.4 28.8 28.0 28.5  MCHC 31.7 32.3 32.9 32.3 33.5  RDW 13.6 13.9 13.6 13.3 12.9  LYMPHSABS 0.9 1.1 1.2 1.2 1.1  MONOABS 0.3 0.3 0.3 0.4 0.5  EOSABS 0.0 0.0 0.0 0.1 0.1  BASOSABS 0.0 0.0 0.1 0.1 0.1    Chemistries  Recent Labs  Lab 08/01/18 0329 08/02/18 0203 08/03/18 0341  08/04/18 0301 08/05/18 0321  NA 139 139 140 141 141  K 3.8 3.7 3.5 3.7 3.4*  CL 107 107 109 103 103  CO2 24 22 23 29 28   GLUCOSE 121* 125* 105* 108* 116*  BUN 16 18 17 19  26*  CREATININE 1.16 0.98 0.81 1.07 0.96  CALCIUM 8.1* 8.5* 8.3* 8.4* 8.9  MG  --  2.3  --   --   --   AST 69* 63* 46* 67* 128*  ALT 57* 60* 49* 72* 152*  ALKPHOS 27* 28* 26* 32* 36*  BILITOT 0.5 0.7 0.5 0.6 0.9    ------------------------------------------------------------------------------------------------------------------ No results for input(s): CHOL, HDL, LDLCALC, TRIG, CHOLHDL, LDLDIRECT in the last 72 hours.  No results found for: HGBA1C ------------------------------------------------------------------------------------------------------------------ No results for input(s): TSH, T4TOTAL, T3FREE, THYROIDAB in the last 72 hours.  Invalid input(s): FREET3 ------------------------------------------------------------------------------------------------------------------ Recent Labs    08/04/18 0301 08/05/18 0321  FERRITIN 849* 937*    Coagulation profile No results for input(s): INR, PROTIME in the last 168 hours.  Recent Labs    08/03/18 0341 08/05/18 0321  DDIMER 1.00* 0.86*    Cardiac Enzymes No results for input(s): CKMB, TROPONINI, MYOGLOBIN in the last 168 hours.  Invalid input(s): CK ------------------------------------------------------------------------------------------------------------------ No results found for: BNP  Inpatient Medications  Scheduled Meds: . enoxaparin (LOVENOX) injection  40 mg Subcutaneous Daily  . furosemide  40 mg Intravenous Daily  . loratadine  10 mg Oral Daily  . sodium chloride flush  3 mL Intravenous Q12H  . tamsulosin  0.4 mg Oral Daily  . vitamin C  1,000 mg Oral Daily   Continuous Infusions: . sodium chloride    . dextrose 5 % and 0.45% NaCl Stopped (08/04/18 1329)   PRN Meds:.sodium chloride, acetaminophen, albuterol, calcium carbonate, cyclobenzaprine, loperamide, sodium chloride, sodium chloride flush, traMADol  Micro Results Recent Results (from the past 240 hour(s))  Novel Coronavirus, NAA (hospital order; send-out to ref lab)     Status: Abnormal   Collection Time: 07/27/18 11:33 AM  Result Value Ref Range Status   SARS-CoV-2, NAA DETECTED (A) NOT DETECTED Final    Comment: Positive (Detected) results are indicative of  active infection with SARS CoV 2. A positive result does not rule out bacterial infection or coinfection with other viruses. Detection of SARS CoV 2 viral RNA may not indicate that SARS CoV 2 is the causative agent for clinical symptoms. Positive and negative predictive values of testing are highly dependent on prevalence. False positive test results are more likely when prevalence is moderate to low. CRITICAL RESULT CALLED TO, READ BACK BY AND VERIFIED WITH: RN Evalee Jefferson [phone] MLM (NOTE) The expected result is Negative (Not Detected). The SARS CoV 2 test is intended for the presumptive qualitative  detection of nucleic acid from SARS CoV 2 in upper and lower  respiratory specimens. Testing methodology is real time RT PCR. Test results must be correlated with clinical presentation and  evaluated in the context of other laboratory and epidemiologic data.  Test performance can be affected because the epidemiology and  clinica l spectrum of infection caused by SARS CoV 2 is not fully  known. For example, the optimum types of specimens to collect and  when during the course of infection these specimens are most likely  to contain detectable viral RNA may not be known. This test has not been Food and Drug Administration (FDA) cleared or  approved and has been authorized by FDA under an Emergency Use  Authorization (EUA).  The test is only authorized for the duration of  the declaration that circumstances exist justifying the authorization  of emergency use of in vitro diagnostic tests for detection and or  diagnosis of SARS CoV 2 under Section 564(b)(1) of the Act, 21 U.S.C.  section [phone] 3(b)(1), unless the authorization is terminated or  revoked sooner. [facility] is certified under the  Clinical Laboratory Improvement Amendments of 1988 (CLIA), 42 U.S.C.  section [phone], to perform high complexity tests. Performed at [facility] CLIA [id number], [address], [place], [place] [address] Laboratory Director: Turner Daniels, MD Performed at [facility], [address] [place], [place] [address]    Coronavirus Source NASOPHARYNGEAL  Final    Comment: Performed at [facility], [address] [place], [place] [address]  MRSA PCR Screening     Status: None   Collection Time: 08/01/18 10:06 AM  Result Value Ref Range Status   MRSA by PCR NEGATIVE NEGATIVE Final    Comment:        The GeneXpert MRSA Assay (FDA approved for NASAL specimens only), is one component of a comprehensive MRSA colonization surveillance program. It is not intended to diagnose MRSA infection nor to guide or monitor treatment for MRSA infections. Performed at [facility], [address], [place], [place] [address]     Radiology Reports Dg Chest Port 1 View  Result Date: 08/02/2018 CLINICAL DATA:  Worsening hypoxia. COVID-19 virus infection. EXAM: PORTABLE CHEST 1 VIEW COMPARISON:  07/31/2018 FINDINGS: Heart size is within normal limits. Low lung volumes are again seen. Multifocal patchy airspace disease in both lungs shows mild worsening since previous study. No evidence of pleural effusion. IMPRESSION: Mild worsening of multifocal bilateral airspace disease. Electronically Signed   By: Myles Rosenthal M.D.   On: 08/02/2018 09:51   Dg Chest Portable 1 View  Result Date: 07/31/2018 CLINICAL DATA:  Increasing shortness of breath and positive Covid-19 diagnosis EXAM: PORTABLE CHEST 1 VIEW COMPARISON:  07/27/2018 FINDINGS: Cardiac shadow is enlarged but stable. Increasing bibasilar infiltrates are noted consistent with the patient's given clinical history. No sizable effusion is seen. No bony abnormality is noted. IMPRESSION: Increasing bibasilar infiltrates. Electronically Signed   By: Alcide Clever M.D.   On: 07/31/2018 15:39   Dg Chest Portable 1 View  Result Date: 07/27/2018 CLINICAL DATA:  Bad head cold  diagnosed last Friday, worsening cough yesterday, negative for influenza, fever 103.8 degrees this morning, history hypertension, former smoker EXAM: PORTABLE CHEST 1 VIEW COMPARISON:  Portable exam 1041 hours without priors for comparison FINDINGS: Normal heart size, mediastinal contours, and pulmonary vascularity. Atherosclerotic calcification aorta. Subsegmental atelectasis RIGHT base. Lungs otherwise clear. No infiltrate, pleural effusion or pneumothorax. IMPRESSION: Subsegmental atelectasis RIGHT lung base. Electronically Signed   By: Ulyses Southward M.D.   On: 07/27/2018 11:02      Huey Bienenstock M.D on 08/05/2018 at 11:07 AM  Between 7am to 7pm - Pager - [phone]  After 7pm go to [email or web address] - password [facility]  [facility] -  Office  [phone]

## 2018-08-05 NOTE — Progress Notes (Addendum)
Pt remains stable overall though appears anxious at times. Pt having trouble tolerating prone position overall due to back pain and discomfort.  Nursing staff educating pt on the importance of prone position.

## 2018-08-06 DIAGNOSIS — R0902 Hypoxemia: Secondary | ICD-10-CM

## 2018-08-06 DIAGNOSIS — J810 Acute pulmonary edema: Secondary | ICD-10-CM

## 2018-08-06 LAB — COMPREHENSIVE METABOLIC PANEL
ALT: 199 U/L — ABNORMAL HIGH (ref 0–44)
AST: 123 U/L — ABNORMAL HIGH (ref 15–41)
Albumin: 2.9 g/dL — ABNORMAL LOW (ref 3.5–5.0)
Alkaline Phosphatase: 36 U/L — ABNORMAL LOW (ref 38–126)
Anion gap: 9 (ref 5–15)
BUN: 31 mg/dL — ABNORMAL HIGH (ref 6–20)
CO2: 26 mmol/L (ref 22–32)
Calcium: 8.5 mg/dL — ABNORMAL LOW (ref 8.9–10.3)
Chloride: 101 mmol/L (ref 98–111)
Creatinine, Ser: 1.09 mg/dL (ref 0.61–1.24)
GFR calc Af Amer: >60 mL/min (ref >60)
GFR calc non Af Amer: >60 mL/min (ref >60)
Glucose, Bld: 108 mg/dL — ABNORMAL HIGH (ref 70–99)
Potassium: 3.4 mmol/L — ABNORMAL LOW (ref 3.5–5.1)
Sodium: 136 mmol/L (ref 135–145)
Total Bilirubin: 0.6 mg/dL (ref 0.3–1.2)
Total Protein: 6.5 g/dL (ref 6.5–8.1)

## 2018-08-06 LAB — LACTATE DEHYDROGENASE: LDH: 354 U/L — ABNORMAL HIGH (ref 98–192)

## 2018-08-06 LAB — CBC
HCT: 41.8 % (ref 39.0–52.0)
Hemoglobin: 13.6 g/dL (ref 13.0–17.0)
MCH: 28.3 pg (ref 26.0–34.0)
MCHC: 32.5 g/dL (ref 30.0–36.0)
MCV: 87.1 fL (ref 80.0–100.0)
Platelets: 365 10*3/uL (ref 150–400)
RBC: 4.8 MIL/uL (ref 4.22–5.81)
RDW: 13.1 % (ref 11.5–15.5)
WBC: 6.4 10*3/uL (ref 4.0–10.5)
nRBC: 0 % (ref 0.0–0.2)

## 2018-08-06 LAB — C-REACTIVE PROTEIN: CRP: 1.4 mg/dL — ABNORMAL HIGH (ref <1.0)

## 2018-08-06 LAB — MAGNESIUM: Magnesium: 2.4 mg/dL (ref 1.7–2.4)

## 2018-08-06 LAB — FERRITIN: Ferritin: 691 ng/mL — ABNORMAL HIGH (ref 24–336)

## 2018-08-06 MED ORDER — ORAL CARE MOUTH RINSE
15.0000 mL | Freq: Two times a day (BID) | OROMUCOSAL | Status: DC
  Administered 2018-08-06 – 2018-08-10 (×9): 15 mL via OROMUCOSAL

## 2018-08-06 MED ORDER — CHLORHEXIDINE GLUCONATE CLOTH 2 % EX PADS
6.0000 | MEDICATED_PAD | Freq: Every day | CUTANEOUS | Status: DC
  Administered 2018-08-06 – 2018-08-08 (×3): 6 via TOPICAL

## 2018-08-06 NOTE — Progress Notes (Signed)
PROGRESS NOTE                                                                                                                                                                                                             Patient Demographics:    Kenneth Briggs, is a 53 y.o. male, DOB - [birth year], ZOX:[id number]  Admit date - 07/31/2018   Admitting Physician [name], MD  Outpatient Primary MD for the patient is System, Pcp Not In  LOS - 6   No chief complaint on file.      Brief Narrative    52 y.o.malewith medical history significant forhtn, obesity, who presented with complains of fever and shortness of breath.  Symptoms began about 1 week prior to hospitalization.  Patient was evaluated in urgent care.  COVID-19 test was ordered.  Since he was stable he was asked to self isolate at home.  Has results to come back positive.  Subsequently he developed more shortness of breath and so decided to come into the hospital at the recommendation of the health department.  Patient's oxygen requirements continued to climb.  Patient was started on hydroxychloroquine.  Subsequently transferred to the intensive care unit.  He was also given Actemra   Subjective:    Kenneth Briggs today reports some cough, nonproductive, complains of lower back pain, was able to tolerate awake pruning yesterday .   Assessment  & Plan :    Principal Problem:   COVID-19 virus infection Active Problems:   Essential hypertension   Obesity   BPH (benign prostatic hyperplasia)   Abscess of groin, right   Acute respiratory disease due to COVID-19 virus   Acute respiratory failure with hypoxia secondary to COVID-19 -Patient had increased oxygen requirement, he was transferred to ICU for 07/31/2018, he did require 7 L high flow nasal cannula, oxygenation has improved, currently on 4 L nasal cannula, discussed with PCCM, B transfer to telemetry floor . -He was treated for 5  days of Plaquenil . -He was treated with azithromycin . -PCCM consult greatly appreciated, he did receive Actemra (follow on QuantiFERON ).continue to monitor LFTs closely . -Likely increased, ferritin with no significant change, CRP is trending down. -Patient encouraged to continue with awake pruning -Continue with IV Lasix, monitor renal function and blood pressure closely.  History of groin abscess Apparently  present for a month.  Apparently has sinus tracts draining small amount of drainage.  Even though there is mild erythema patient states that this is been present for a while.  Local wound care.  No changes per patient.  Transaminitis -LFTs increased today, he did receive Actemra, continue to monitor closely  Essential hypertension Blood pressure noted to be soft.  Holding valsartan.    History of BPH Stable.  Hypokalemia - Repleted    Code Status : Partial  Family Communication  : D/W wife via phone 08/06/2018  Disposition Plan  : Transfer to telemetry  Consults  :  PCCM  Procedures  : None  DVT Prophylaxis  :  [place] lovenox  Lab Results  Component Value Date   PLT 365 08/06/2018    Antibiotics  :    Anti-infectives (From admission, onward)   Start     Dose/Rate Route Frequency Ordered Stop   08/03/18 2200  hydroxychloroquine (PLAQUENIL) tablet 200 mg  Status:  Discontinued     200 mg Oral 2 times daily 08/02/18 1738 08/02/18 1744   08/02/18 2200  hydroxychloroquine (PLAQUENIL) tablet 400 mg  Status:  Discontinued     400 mg Oral 2 times daily 08/02/18 1738 08/02/18 1744   08/01/18 2200  hydroxychloroquine (PLAQUENIL) tablet 200 mg     200 mg Oral 2 times daily 07/31/18 2213 08/05/18 0809   07/31/18 2215  hydroxychloroquine (PLAQUENIL) tablet 400 mg     400 mg Oral 2 times daily 07/31/18 2213 08/01/18 0958        Objective:   Vitals:   08/06/18 0400 08/06/18 0500 08/06/18 0600 08/06/18 0837  BP: 100/65 102/65 101/60   Pulse: 65 (!) 58 (!) 53   Resp:  20 (!) 22 20   Temp:    97.8 F (36.6 C)  TempSrc:    Oral  SpO2: 95% 93% 94%   Weight:      Height:        Wt Readings from Last 3 Encounters:  08/05/18 119.4 kg  07/31/18 127 kg  07/27/18 127 kg     Intake/Output Summary (Last 24 hours) at 08/06/2018 1134 Last data filed at 08/06/2018 0347 Gross per 24 hour  Intake 3 ml  Output 275 ml  Net -272 ml     Physical Exam  Awake Alert, Oriented X 3, No new F.N deficits, Normal affect Symmetrical Chest wall movement, Good air movement bilaterally, CTAB RRR,No Gallops,Rubs or new Murmurs, No Parasternal Heave +ve B.Sounds, Abd Soft, No tenderness, No rebound - guarding or rigidity. No Cyanosis, Clubbing or edema, No new Rash or bruise     PPE:  - 08/06/2018 -Patient isolation: Airborne + contact - wearing all recommended PPE: PAPR, gloves, surgicalcap.    Data Review:    CBC Recent Labs  Lab 08/01/18 0329 08/02/18 0203 08/03/18 0341 08/04/18 0301 08/05/18 0321 08/06/18 0310  WBC 7.9 10.1 6.1 6.0 6.2 6.4  HGB 12.0* 13.0 12.2* 13.2 13.6 13.6  HCT 37.8* 40.3 37.1* 40.9 40.6 41.8  PLT 185 244 275 326 370 365  MCV 88.5 88.0 87.7 86.8 85.1 87.1  MCH 28.1 28.4 28.8 28.0 28.5 28.3  MCHC 31.7 32.3 32.9 32.3 33.5 32.5  RDW 13.6 13.9 13.6 13.3 12.9 13.1  LYMPHSABS 0.9 1.1 1.2 1.2 1.1  --   MONOABS 0.3 0.3 0.3 0.4 0.5  --   EOSABS 0.0 0.0 0.0 0.1 0.1  --   BASOSABS 0.0 0.0 0.1 0.1 0.1  --  Chemistries  Recent Labs  Lab 08/02/18 0203 08/03/18 0341 08/04/18 0301 08/05/18 0321 08/06/18 0310  NA 139 140 141 141 136  K 3.7 3.5 3.7 3.4* 3.4*  CL 107 109 103 103 101  CO2 22 23 29 28 26   GLUCOSE 125* 105* 108* 116* 108*  BUN 18 17 19  26* 31*  CREATININE 0.98 0.81 1.07 0.96 1.09  CALCIUM 8.5* 8.3* 8.4* 8.9 8.5*  MG 2.3  --   --   --  2.4  AST 63* 46* 67* 128* 123*  ALT 60* 49* 72* 152* 199*  ALKPHOS 28* 26* 32* 36* 36*  BILITOT 0.7 0.5 0.6 0.9 0.6    ------------------------------------------------------------------------------------------------------------------ No results for input(s): CHOL, HDL, LDLCALC, TRIG, CHOLHDL, LDLDIRECT in the last 72 hours.  No results found for: HGBA1C ------------------------------------------------------------------------------------------------------------------ No results for input(s): TSH, T4TOTAL, T3FREE, THYROIDAB in the last 72 hours.  Invalid input(s): FREET3 ------------------------------------------------------------------------------------------------------------------ Recent Labs    08/05/18 0321 08/06/18 0310  FERRITIN 937* 691*    Coagulation profile No results for input(s): INR, PROTIME in the last 168 hours.  Recent Labs    08/05/18 0321  DDIMER 0.86*    Cardiac Enzymes No results for input(s): CKMB, TROPONINI, MYOGLOBIN in the last 168 hours.  Invalid input(s): CK ------------------------------------------------------------------------------------------------------------------ No results found for: BNP  Inpatient Medications  Scheduled Meds: . Chlorhexidine Gluconate Cloth  6 each Topical Daily  . enoxaparin (LOVENOX) injection  60 mg Subcutaneous Daily  . furosemide  40 mg Intravenous Daily  . loratadine  10 mg Oral Daily  . mouth rinse  15 mL Mouth Rinse BID  . sodium chloride flush  3 mL Intravenous Q12H  . tamsulosin  0.4 mg Oral Daily  . vitamin C  1,000 mg Oral Daily   Continuous Infusions: . sodium chloride     PRN Meds:.sodium chloride, acetaminophen, albuterol, calcium carbonate, cyclobenzaprine, loperamide, sodium chloride, sodium chloride flush, traMADol  Micro Results Recent Results (from the past 240 hour(s))  MRSA PCR Screening     Status: None   Collection Time: 08/01/18 10:06 AM  Result Value Ref Range Status   MRSA by PCR NEGATIVE NEGATIVE Final    Comment:        The GeneXpert MRSA Assay (FDA approved for NASAL specimens only), is one  component of a comprehensive MRSA colonization surveillance program. It is not intended to diagnose MRSA infection nor to guide or monitor treatment for MRSA infections. Performed at [facility], [address] [place], [place] [address]     Radiology Reports Dg Chest Port 1 View  Result Date: 08/02/2018 CLINICAL DATA:  Worsening hypoxia. COVID-19 virus infection. EXAM: PORTABLE CHEST 1 VIEW COMPARISON:  07/31/2018 FINDINGS: Heart size is within normal limits. Low lung volumes are again seen. Multifocal patchy airspace disease in both lungs shows mild worsening since previous study. No evidence of pleural effusion. IMPRESSION: Mild worsening of multifocal bilateral airspace disease. Electronically Signed   By: Myles RosenthalJohn  Stahl M.D.   On: 08/02/2018 09:51   Dg Chest Portable 1 View  Result Date: 07/31/2018 CLINICAL DATA:  Increasing shortness of breath and positive Covid-19 diagnosis EXAM: PORTABLE CHEST 1 VIEW COMPARISON:  07/27/2018 FINDINGS: Cardiac shadow is enlarged but stable. Increasing bibasilar infiltrates are noted consistent with the patient's given clinical history. No sizable effusion is seen. No bony abnormality is noted. IMPRESSION: Increasing bibasilar infiltrates. Electronically Signed   By: Alcide CleverMark  Lukens M.D.   On: 07/31/2018 15:39   Dg Chest Portable 1 View  Result Date: 07/27/2018 CLINICAL DATA:  Bad head cold diagnosed last Friday, worsening cough yesterday, negative for influenza, fever 103.8 degrees this morning, history hypertension, former smoker EXAM: PORTABLE CHEST 1 VIEW COMPARISON:  Portable exam 1041 hours without priors for comparison FINDINGS: Normal heart size, mediastinal contours, and pulmonary vascularity. Atherosclerotic calcification aorta. Subsegmental atelectasis RIGHT base. Lungs otherwise clear. No infiltrate, pleural effusion or pneumothorax. IMPRESSION: Subsegmental atelectasis RIGHT lung base. Electronically Signed   By: Ulyses Southward M.D.    On: 07/27/2018 11:02      Huey Bienenstock M.D on 08/06/2018 at 11:34 AM  Between 7am to 7pm - Pager - [phone]  After 7pm go to [email or web address] - password [facility]  [facility] -  Office  [phone]

## 2018-08-06 NOTE — Progress Notes (Signed)
NAME:  Kenneth CollardDavid S Briggs, MRN:  [id number], DOB:  03/01/1966, LOS: 6 ADMISSION DATE:  07/31/2018, CONSULTATION DATE:  08/02/18 REFERRING MD:  Kenneth ShipperGokul Krishnan, MD CHIEF COMPLAINT:  Hypoxemia in COVID-19  Brief History   53 year old male with morbid obesity and recent diagnosis of COVID-19 pneumonia on 3/30 who is admitted for acute hypoxemic respiratory failure to Hospitalist service on 4/3. Now transferred to step-down unit for worsening hypoxemia requiring 2L to 7L via [place].  Past Medical History  Morbid obesity, HTN  Significant Hospital Events   4/3 Admitted 4/4 Transferred to ICU 4/5-4/6 Received Tocilizumab x 2 doses   Consults:  PCCM  Procedures:    Significant Diagnostic Tests:    Micro Data:  Nasopharyngeal COVID-19 - Positive  Antimicrobials:    Interim history/subjective:  No events overnight, no new complaints Feels better  Objective   Blood pressure 98/64, pulse (!) 57, temperature 97.8 F (36.6 C), temperature source Oral, resp. rate (!) 22, height 5\' 10"  (1.778 m), weight 119.4 kg, SpO2 96 %.        Intake/Output Summary (Last 24 hours) at 08/06/2018 1157 Last data filed at 08/06/2018 1100 Gross per 24 hour  Intake 243 ml  Output 575 ml  Net -332 ml   Filed Weights   07/31/18 2100 07/31/18 2129 08/05/18 0300  Weight: 124.5 kg 124.5 kg 119.4 kg   Gen:      Well appearing, NAD HEENT:  [place]/AT, PERRL, EOM-I and MMM Neck:      Supple Lungs:    CTA bilaterally anteriorily CV:         RRR, Nl S1/S2 and -M/R/G Abd:      Soft, NT, ND and +BS Ext:    -edema and -tenderness Skin:       Intact Neuro:    Alert and interactive, moving all ext to command Psych:   normal mood and affect  I reviewed CXR myself, infiltrate noted  Resolved Hospital Problem list    Assessment & Plan:  ARDS due to COVID-19 No need for intubation Titrate O2 for sat of 88-92% D/C HFNC Pt encouraged to lay in prone position for as long as possible- Awake proning Got 2 doses of IL6  inhibitor, d/c further Finish 5 days of plaquenil and already finished azithromycin Continue lasix for diuresis, keep dry as able  Goals of care Had a long discussion with patient who was able to participate fully on 4/6. He does want intubation if needed but does not want CPR and defib in case of cardiac arrest. His code status had been changed to limited code.   Best practice:  Diet: Regular diet Pain/Anxiety/Delirium protocol (if indicated): No VAP protocol (if indicated): No DVT prophylaxis: Lovenox GI prophylaxis: None Glucose control: per primary Mobility: Prone Code Status: Limited code Family Communication: Updated Disposition: ICU  Labs   CBC: Recent Labs  Lab 08/01/18 0329 08/02/18 0203 08/03/18 0341 08/04/18 0301 08/05/18 0321 08/06/18 0310  WBC 7.9 10.1 6.1 6.0 6.2 6.4  NEUTROABS 6.7 8.4* 4.1 3.8 4.1  --   HGB 12.0* 13.0 12.2* 13.2 13.6 13.6  HCT 37.8* 40.3 37.1* 40.9 40.6 41.8  MCV 88.5 88.0 87.7 86.8 85.1 87.1  PLT 185 244 275 326 370 365    Basic Metabolic Panel: Recent Labs  Lab 08/02/18 0203 08/03/18 0341 08/04/18 0301 08/05/18 0321 08/06/18 0310  NA 139 140 141 141 136  K 3.7 3.5 3.7 3.4* 3.4*  CL 107 109 103 103 101  CO2  22 23 29 28 26   GLUCOSE 125* 105* 108* 116* 108*  BUN 18 17 19  26* 31*  CREATININE 0.98 0.81 1.07 0.96 1.09  CALCIUM 8.5* 8.3* 8.4* 8.9 8.5*  MG 2.3  --   --   --  2.4   GFR: Estimated Creatinine Clearance: 105 mL/min (by C-G formula based on SCr of 1.09 mg/dL). Recent Labs  Lab 07/31/18 1542  08/03/18 0341 08/04/18 0301 08/05/18 0321 08/06/18 0310  WBC  --    < > 6.1 6.0 6.2 6.4  LATICACIDVEN 1.1  --   --   --   --   --    < > = values in this interval not displayed.    Liver Function Tests: Recent Labs  Lab 08/02/18 0203 08/03/18 0341 08/04/18 0301 08/05/18 0321 08/06/18 0310  AST 63* 46* 67* 128* 123*  ALT 60* 49* 72* 152* 199*  ALKPHOS 28* 26* 32* 36* 36*  BILITOT 0.7 0.5 0.6 0.9 0.6  PROT 7.7  6.4* 6.7 6.9 6.5  ALBUMIN 3.2* 2.7* 2.8* 3.2* 2.9*   No results for input(s): LIPASE, AMYLASE in the last 168 hours. No results for input(s): AMMONIA in the last 168 hours.  ABG    Component Value Date/Time   PHART 7.441 08/02/2018 2213   PCO2ART 36.2 08/02/2018 2213   PO2ART 78.5 (L) 08/02/2018 2213   HCO3 24.2 08/02/2018 2213   ACIDBASEDEF 0.9 08/02/2018 1224   O2SAT 96.0 08/02/2018 2213     Coagulation Profile: No results for input(s): INR, PROTIME in the last 168 hours.  Cardiac Enzymes: Recent Labs  Lab 08/02/18 0203  CKTOTAL 357    HbA1C: No results found for: HGBA1C  CBG: No results for input(s): GLUCAP in the last 168 hours.  Discussed with [facility]-MD Ok to transfer out of the ICU, PCCM will sign off, please call back if needed, recommendations as above  Kenneth Briggs, M.D. [facility] Pulmonary/Critical Care Medicine. Pager: [phone]. After hours pager: [phone].

## 2018-08-07 LAB — MAGNESIUM: Magnesium: 2.3 mg/dL (ref 1.7–2.4)

## 2018-08-07 LAB — COMPREHENSIVE METABOLIC PANEL
ALT: 183 U/L — ABNORMAL HIGH (ref 0–44)
AST: 79 U/L — ABNORMAL HIGH (ref 15–41)
Albumin: 3.1 g/dL — ABNORMAL LOW (ref 3.5–5.0)
Alkaline Phosphatase: 38 U/L (ref 38–126)
Anion gap: 9 (ref 5–15)
BUN: 29 mg/dL — ABNORMAL HIGH (ref 6–20)
CO2: 30 mmol/L (ref 22–32)
Calcium: 8.7 mg/dL — ABNORMAL LOW (ref 8.9–10.3)
Chloride: 101 mmol/L (ref 98–111)
Creatinine, Ser: 1.19 mg/dL (ref 0.61–1.24)
GFR calc Af Amer: >60 mL/min (ref >60)
GFR calc non Af Amer: >60 mL/min (ref >60)
Glucose, Bld: 112 mg/dL — ABNORMAL HIGH (ref 70–99)
Potassium: 3.7 mmol/L (ref 3.5–5.1)
Sodium: 140 mmol/L (ref 135–145)
Total Bilirubin: 0.6 mg/dL (ref 0.3–1.2)
Total Protein: 6.6 g/dL (ref 6.5–8.1)

## 2018-08-07 LAB — CBC
HCT: 41.4 % (ref 39.0–52.0)
Hemoglobin: 13.8 g/dL (ref 13.0–17.0)
MCH: 28.7 pg (ref 26.0–34.0)
MCHC: 33.3 g/dL (ref 30.0–36.0)
MCV: 86.1 fL (ref 80.0–100.0)
Platelets: 385 10*3/uL (ref 150–400)
RBC: 4.81 MIL/uL (ref 4.22–5.81)
RDW: 13 % (ref 11.5–15.5)
WBC: 7.5 10*3/uL (ref 4.0–10.5)
nRBC: 0 % (ref 0.0–0.2)

## 2018-08-07 LAB — LACTATE DEHYDROGENASE: LDH: 275 U/L — ABNORMAL HIGH (ref 98–192)

## 2018-08-07 LAB — FERRITIN: Ferritin: 613 ng/mL — ABNORMAL HIGH (ref 24–336)

## 2018-08-07 LAB — C-REACTIVE PROTEIN: CRP: 0.9 mg/dL (ref <1.0)

## 2018-08-07 MED ORDER — POTASSIUM CHLORIDE CRYS ER 20 MEQ PO TBCR
40.0000 meq | EXTENDED_RELEASE_TABLET | Freq: Once | ORAL | Status: AC
  Administered 2018-08-07: 40 meq via ORAL
  Filled 2018-08-07: qty 2

## 2018-08-07 NOTE — Progress Notes (Addendum)
Report given to Lanora Manis, RN on 4th floor. Patient stable for transfer to room 1437 as a low-risk positive Covid-19 patient. Patient transferred to 1437 with Mitzi Davenport, Consulting civil engineer, and [place], [place].

## 2018-08-07 NOTE — Progress Notes (Signed)
Assumed care of patient from Kiristin R. RN. Patient placed on telemetry box 37. Patient belongings within reach. Bed in the lowest locked position, call light within reach. Agree with previous assessment of patient. Will continue to monitor.

## 2018-08-07 NOTE — Progress Notes (Signed)
PROGRESS NOTE                                                                                                                                                                                                             Patient Demographics:    Kenneth Briggs, is a 53 y.o. male, DOB - [birth year], JJH:[id number]  Admit date - 07/31/2018   Admitting Physician [name], MD  Outpatient Primary MD for the patient is System, Pcp Not In  LOS - 7   No chief complaint on file.      Brief Narrative    52 y.o.malewith medical history significant forhtn, obesity, who presented with complains of fever and shortness of breath.  Symptoms began about 1 week prior to hospitalization.  Patient was evaluated in urgent care.  COVID-19 test was ordered.  Since he was stable he was asked to self isolate at home.  Has results to come back positive.  Subsequently he developed more shortness of breath and so decided to come into the hospital at the recommendation of the health department.  Patient's oxygen requirements continued to climb.  Patient was started on hydroxychloroquine.  Subsequently transferred to the intensive care unit.  He was also given Actemra   Subjective:    [name] today was transferred to telemetry floor, reports he is feeling better, dyspnea has improved,    Assessment  & Plan :    Principal Problem:   COVID-19 virus infection Active Problems:   Essential hypertension   Obesity   BPH (benign prostatic hyperplasia)   Abscess of groin, right   Acute respiratory disease due to COVID-19 virus   Hypoxemia   Acute pulmonary edema (HCC)   Acute respiratory failure with hypoxia secondary to COVID-19 -Patient had increased oxygen requirement, he was transferred to ICU for 07/31/2018, he did require 7 L high flow nasal cannula, oxygenation has improved, he is on 4 L nasal cannula, back on telemetry floor . -He was treated for 5 days of  Plaquenil . -He was treated with azithromycin . -PCCM consult greatly appreciated, he did receive Actemra -CRP has normalized, LDH and ferritin trending down -Patient encouraged to continue with awake proning -Continue with IV Lasix, monitor renal function and blood pressure closely.  History of groin abscess Apparently present for a month.  Apparently has sinus tracts  draining small amount of drainage.  Even though there is mild erythema patient states that this is been present for a while.  Local wound care.  No changes per patient.  Transaminitis -  LFTs are trending down, has increased after Actemra,  Essential hypertension Blood pressure noted to be soft.  Holding valsartan.    History of BPH Stable.  Hypokalemia - Repleted    Code Status : Partial  Family Communication  : D/W wife via phone 08/07/2018  Disposition Plan  : Home when stable  Consults  :  PCCM  Procedures  : None  DVT Prophylaxis  :  [place] lovenox  Lab Results  Component Value Date   PLT 385 08/07/2018    Antibiotics  :    Anti-infectives (From admission, onward)   Start     Dose/Rate Route Frequency Ordered Stop   08/03/18 2200  hydroxychloroquine (PLAQUENIL) tablet 200 mg  Status:  Discontinued     200 mg Oral 2 times daily 08/02/18 1738 08/02/18 1744   08/02/18 2200  hydroxychloroquine (PLAQUENIL) tablet 400 mg  Status:  Discontinued     400 mg Oral 2 times daily 08/02/18 1738 08/02/18 1744   08/01/18 2200  hydroxychloroquine (PLAQUENIL) tablet 200 mg     200 mg Oral 2 times daily 07/31/18 2213 08/05/18 0809   07/31/18 2215  hydroxychloroquine (PLAQUENIL) tablet 400 mg     400 mg Oral 2 times daily 07/31/18 2213 08/01/18 0958        Objective:   Vitals:   08/07/18 0240 08/07/18 0331 08/07/18 1301 08/07/18 1302  BP:  117/78 105/75 105/75  Pulse: (!) 52 75 89 80  Resp: Temp:  98 F (36.7 C) 98.5 F (36.9 C) 98.5 F (36.9 C)  TempSrc:  Oral Oral Oral  SpO2: 96% 94% 95%  95%  Weight:      Height:        Wt Readings from Last 3 Encounters:  08/06/18 119.7 kg  07/31/18 127 kg  07/27/18 127 kg     Intake/Output Summary (Last 24 hours) at 08/07/2018 1334 Last data filed at 08/07/2018 1300 Gross per 24 hour  Intake 253 ml  Output 2100 ml  Net -1847 ml     Physical Exam  Awake Alert, Oriented X 3, No new F.N deficits, Normal affect Symmetrical Chest wall movement, Good air movement bilaterally, CTAB RRR,No Gallops,Rubs or new Murmurs, No Parasternal Heave +ve B.Sounds, Abd Soft, No tenderness, No rebound - guarding or rigidity. No Cyanosis, Clubbing or edema, No new Rash or bruise      PPE:  - 08/07/2018 -Patient isolation: Airborne + contact - wearing all recommended PPE: CAPR, gloves, surgicalcap.    Data Review:    CBC Recent Labs  Lab 08/01/18 0329 08/02/18 0203 08/03/18 0341 08/04/18 0301 08/05/18 0321 08/06/18 0310 08/07/18 0301  WBC 7.9 10.1 6.1 6.0 6.2 6.4 7.5  HGB 12.0* 13.0 12.2* 13.2 13.6 13.6 13.8  HCT 37.8* 40.3 37.1* 40.9 40.6 41.8 41.4  PLT 185 244 275 326 370 365 385  MCV 88.5 88.0 87.7 86.8 85.1 87.1 86.1  MCH 28.1 28.4 28.8 28.0 28.5 28.3 28.7  MCHC 31.7 32.3 32.9 32.3 33.5 32.5 33.3  RDW 13.6 13.9 13.6 13.3 12.9 13.1 13.0  LYMPHSABS 0.9 1.1 1.2 1.2 1.1  --   --   MONOABS 0.3 0.3 0.3 0.4 0.5  --   --   EOSABS 0.0 0.0 0.0 0.1 0.1  --   --  BASOSABS 0.0 0.0 0.1 0.1 0.1  --   --     Chemistries  Recent Labs  Lab 08/02/18 0203 08/03/18 0341 08/04/18 0301 08/05/18 0321 08/06/18 0310 08/07/18 0301  NA 139 140 141 141 136 140  K 3.7 3.5 3.7 3.4* 3.4* 3.7  CL 107 109 103 103 101 101  CO2 22 23 29 28 26 30   GLUCOSE 125* 105* 108* 116* 108* 112*  BUN 18 17 19  26* 31* 29*  CREATININE 0.98 0.81 1.07 0.96 1.09 1.19  CALCIUM 8.5* 8.3* 8.4* 8.9 8.5* 8.7*  MG 2.3  --   --   --  2.4 2.3  AST 63* 46* 67* 128* 123* 79*  ALT 60* 49* 72* 152* 199* 183*  ALKPHOS 28* 26* 32* 36* 36* 38  BILITOT 0.7 0.5 0.6 0.9  0.6 0.6   ------------------------------------------------------------------------------------------------------------------ No results for input(s): CHOL, HDL, LDLCALC, TRIG, CHOLHDL, LDLDIRECT in the last 72 hours.  No results found for: HGBA1C ------------------------------------------------------------------------------------------------------------------ No results for input(s): TSH, T4TOTAL, T3FREE, THYROIDAB in the last 72 hours.  Invalid input(s): FREET3 ------------------------------------------------------------------------------------------------------------------ Recent Labs    08/06/18 0310 08/07/18 0301  FERRITIN 691* 613*    Coagulation profile No results for input(s): INR, PROTIME in the last 168 hours.  Recent Labs    08/05/18 0321  DDIMER 0.86*    Cardiac Enzymes No results for input(s): CKMB, TROPONINI, MYOGLOBIN in the last 168 hours.  Invalid input(s): CK ------------------------------------------------------------------------------------------------------------------ No results found for: BNP  Inpatient Medications  Scheduled Meds: . Chlorhexidine Gluconate Cloth  6 each Topical Daily  . enoxaparin (LOVENOX) injection  60 mg Subcutaneous Daily  . furosemide  40 mg Intravenous Daily  . loratadine  10 mg Oral Daily  . mouth rinse  15 mL Mouth Rinse BID  . sodium chloride flush  3 mL Intravenous Q12H  . tamsulosin  0.4 mg Oral Daily  . vitamin C  1,000 mg Oral Daily   Continuous Infusions: . sodium chloride     PRN Meds:.sodium chloride, acetaminophen, albuterol, calcium carbonate, cyclobenzaprine, loperamide, sodium chloride, sodium chloride flush, traMADol  Micro Results Recent Results (from the past 240 hour(s))  MRSA PCR Screening     Status: None   Collection Time: 08/01/18 10:06 AM  Result Value Ref Range Status   MRSA by PCR NEGATIVE NEGATIVE Final    Comment:        The GeneXpert MRSA Assay (FDA approved for NASAL specimens only),  is one component of a comprehensive MRSA colonization surveillance program. It is not intended to diagnose MRSA infection nor to guide or monitor treatment for MRSA infections. Performed at [facility], [address] [place], [place] [address]     Radiology Reports Dg Chest Port 1 View  Result Date: 08/02/2018 CLINICAL DATA:  Worsening hypoxia. COVID-19 virus infection. EXAM: PORTABLE CHEST 1 VIEW COMPARISON:  07/31/2018 FINDINGS: Heart size is within normal limits. Low lung volumes are again seen. Multifocal patchy airspace disease in both lungs shows mild worsening since previous study. No evidence of pleural effusion. IMPRESSION: Mild worsening of multifocal bilateral airspace disease. Electronically Signed   By: Myles RosenthalJohn  Stahl M.D.   On: 08/02/2018 09:51   Dg Chest Portable 1 View  Result Date: 07/31/2018 CLINICAL DATA:  Increasing shortness of breath and positive Covid-19 diagnosis EXAM: PORTABLE CHEST 1 VIEW COMPARISON:  07/27/2018 FINDINGS: Cardiac shadow is enlarged but stable. Increasing bibasilar infiltrates are noted consistent with the patient's given clinical history. No sizable effusion is seen. No  bony abnormality is noted. IMPRESSION: Increasing bibasilar infiltrates. Electronically Signed   By: Alcide Clever M.D.   On: 07/31/2018 15:39   Dg Chest Portable 1 View  Result Date: 07/27/2018 CLINICAL DATA:  Bad head cold diagnosed last Friday, worsening cough yesterday, negative for influenza, fever 103.8 degrees this morning, history hypertension, former smoker EXAM: PORTABLE CHEST 1 VIEW COMPARISON:  Portable exam 1041 hours without priors for comparison FINDINGS: Normal heart size, mediastinal contours, and pulmonary vascularity. Atherosclerotic calcification aorta. Subsegmental atelectasis RIGHT base. Lungs otherwise clear. No infiltrate, pleural effusion or pneumothorax. IMPRESSION: Subsegmental atelectasis RIGHT lung base. Electronically Signed   By: Ulyses Southward M.D.   On: 07/27/2018 11:02      Huey Bienenstock M.D on 08/07/2018 at 1:34 PM  Between 7am to 7pm - Pager - [phone]  After 7pm go to [email or web address] - password [facility]  [facility] -  Office  [phone]

## 2018-08-08 LAB — COMPREHENSIVE METABOLIC PANEL
ALT: 155 U/L — ABNORMAL HIGH (ref 0–44)
AST: 56 U/L — ABNORMAL HIGH (ref 15–41)
Albumin: 3 g/dL — ABNORMAL LOW (ref 3.5–5.0)
Alkaline Phosphatase: 36 U/L — ABNORMAL LOW (ref 38–126)
Anion gap: 8 (ref 5–15)
BUN: 29 mg/dL — ABNORMAL HIGH (ref 6–20)
CO2: 27 mmol/L (ref 22–32)
Calcium: 8.7 mg/dL — ABNORMAL LOW (ref 8.9–10.3)
Chloride: 105 mmol/L (ref 98–111)
Creatinine, Ser: 1.07 mg/dL (ref 0.61–1.24)
GFR calc Af Amer: >60 mL/min (ref >60)
GFR calc non Af Amer: >60 mL/min (ref >60)
Glucose, Bld: 114 mg/dL — ABNORMAL HIGH (ref 70–99)
Potassium: 3.5 mmol/L (ref 3.5–5.1)
Sodium: 140 mmol/L (ref 135–145)
Total Bilirubin: 0.6 mg/dL (ref 0.3–1.2)
Total Protein: 6.5 g/dL (ref 6.5–8.1)

## 2018-08-08 LAB — CBC
HCT: 42.1 % (ref 39.0–52.0)
Hemoglobin: 13.8 g/dL (ref 13.0–17.0)
MCH: 28.3 pg (ref 26.0–34.0)
MCHC: 32.8 g/dL (ref 30.0–36.0)
MCV: 86.4 fL (ref 80.0–100.0)
Platelets: 370 10*3/uL (ref 150–400)
RBC: 4.87 MIL/uL (ref 4.22–5.81)
RDW: 13.2 % (ref 11.5–15.5)
WBC: 6.7 10*3/uL (ref 4.0–10.5)
nRBC: 0 % (ref 0.0–0.2)

## 2018-08-08 LAB — MAGNESIUM: Magnesium: 2.5 mg/dL — ABNORMAL HIGH (ref 1.7–2.4)

## 2018-08-08 MED ORDER — POTASSIUM CHLORIDE CRYS ER 20 MEQ PO TBCR
40.0000 meq | EXTENDED_RELEASE_TABLET | Freq: Once | ORAL | Status: AC
  Administered 2018-08-08: 40 meq via ORAL
  Filled 2018-08-08: qty 2

## 2018-08-08 NOTE — Plan of Care (Signed)
Able to wean pt to 1L O2 today, sats at 97%.

## 2018-08-08 NOTE — Progress Notes (Signed)
PROGRESS NOTE                                                                                                                                                                                                             Patient Demographics:    Kenneth Briggs, is a 53 y.o. male, DOB - 1966/03/13, MRA:[id number]  Admit date - 07/31/2018   Admitting Physician [name], MD  Outpatient Primary MD for the patient is System, Pcp Not In  LOS - 8   No chief complaint on file.      Brief Narrative    52 y.o.malewith medical history significant forhtn, obesity, who presented with complains of fever and shortness of breath.  Symptoms began about 1 week prior to hospitalization.  Patient was evaluated in urgent care.  COVID-19 test was ordered.  Since he was stable he was asked to self isolate at home.  Has results to come back positive.  Subsequently he developed more shortness of breath and so decided to come into the hospital at the recommendation of the health department.  Patient's oxygen requirements continued to climb.  Patient was started on hydroxychloroquine.  Subsequently transferred to the intensive care unit.  He was also given Actemra   Subjective:    [name] today reports dyspnea has improved, ambulated to the bathroom couple times with no significant dyspnea, he is on 3 L nasal cannula, denies any cough .   Assessment  & Plan :    Principal Problem:   COVID-19 virus infection Active Problems:   Essential hypertension   Obesity   BPH (benign prostatic hyperplasia)   Abscess of groin, right   Acute respiratory disease due to COVID-19 virus   Hypoxemia   Acute pulmonary edema (HCC)   Acute respiratory failure with hypoxia secondary to COVID-19 -Patient had increased oxygen requirement, he was transferred to ICU for 07/31/2018, he did require 7 L high flow nasal cannula, degeneration has improved, he is on 3 L nasal cannula  today .he was encouraged to ambulate out of bed to chair, continue with awake pronating . -He was treated for 5 days of Plaquenil . -He was treated with azithromycin . -PCCM consult greatly appreciated, he did receive Actemra -CRP has normalized, LDH and ferritin trending down -Continue with Lasix IV 40 mg daily, monitor renal function closely,  replete potassium as needed  History of groin abscess Apparently present for a month.  Apparently has sinus tracts draining small amount of drainage.  Even though there is mild erythema patient states that this is been present for a while.  Local wound care.  No changes per patient.  Transaminitis -  LFTs are trending down, has increased after Actemra,  Essential hypertension Blood pressure noted to be soft.  Holding valsartan.    History of BPH Stable.  Hypokalemia - Repleted    Code Status : Partial  Family Communication  : D/W wife via phone 08/07/2018  Disposition Plan  : Home when stable  Consults  :  PCCM  Procedures  : None  DVT Prophylaxis  :  [place] lovenox  Lab Results  Component Value Date   PLT 370 08/08/2018    Antibiotics  :    Anti-infectives (From admission, onward)   Start     Dose/Rate Route Frequency Ordered Stop   08/03/18 2200  hydroxychloroquine (PLAQUENIL) tablet 200 mg  Status:  Discontinued     200 mg Oral 2 times daily 08/02/18 1738 08/02/18 1744   08/02/18 2200  hydroxychloroquine (PLAQUENIL) tablet 400 mg  Status:  Discontinued     400 mg Oral 2 times daily 08/02/18 1738 08/02/18 1744   08/01/18 2200  hydroxychloroquine (PLAQUENIL) tablet 200 mg     200 mg Oral 2 times daily 07/31/18 2213 08/05/18 0809   07/31/18 2215  hydroxychloroquine (PLAQUENIL) tablet 400 mg     400 mg Oral 2 times daily 07/31/18 2213 08/01/18 0958        Objective:   Vitals:   08/07/18 1302 08/07/18 1826 08/07/18 2122 08/08/18 0539  BP: 105/75 116/83 105/72 106/68  Pulse: 80 (!) 104 89 64  Resp: Temp:  98.5 F (36.9 C) 98 F (36.7 C) 98.2 F (36.8 C) 97.9 F (36.6 C)  TempSrc: Oral Oral Oral Oral  SpO2: 95% 96% 97% 98%  Weight:      Height:        Wt Readings from Last 3 Encounters:  08/06/18 119.7 kg  07/31/18 127 kg  07/27/18 127 kg     Intake/Output Summary (Last 24 hours) at 08/08/2018 1328 Last data filed at 08/08/2018 1030 Gross per 24 hour  Intake 396 ml  Output 1200 ml  Net -804 ml     Physical Exam  Awake Alert, Oriented X 3, No new F.N deficits, Normal affect Symmetrical Chest wall movement, Good air movement bilaterally, CTAB RRR,No Gallops,Rubs or new Murmurs, No Parasternal Heave +ve B.Sounds, Abd Soft, No tenderness, No rebound - guarding or rigidity. No Cyanosis, Clubbing or edema, No new Rash or bruise      PPE:  - 08/08/2018 -Patient isolation: droplets+ contact - wearing all recommended PPE: CAPR, gloves, surgicalcap.    Data Review:    CBC Recent Labs  Lab 08/02/18 0203 08/03/18 0341 08/04/18 0301 08/05/18 0321 08/06/18 0310 08/07/18 0301 08/08/18 0422  WBC 10.1 6.1 6.0 6.2 6.4 7.5 6.7  HGB 13.0 12.2* 13.2 13.6 13.6 13.8 13.8  HCT 40.3 37.1* 40.9 40.6 41.8 41.4 42.1  PLT 244 275 326 370 365 385 370  MCV 88.0 87.7 86.8 85.1 87.1 86.1 86.4  MCH 28.4 28.8 28.0 28.5 28.3 28.7 28.3  MCHC 32.3 32.9 32.3 33.5 32.5 33.3 32.8  RDW 13.9 13.6 13.3 12.9 13.1 13.0 13.2  LYMPHSABS 1.1 1.2 1.2 1.1  --   --   --  MONOABS 0.3 0.3 0.4 0.5  --   --   --   EOSABS 0.0 0.0 0.1 0.1  --   --   --   BASOSABS 0.0 0.1 0.1 0.1  --   --   --     Chemistries  Recent Labs  Lab 08/02/18 0203  08/04/18 0301 08/05/18 0321 08/06/18 0310 08/07/18 0301 08/08/18 0422  NA 139   < > 141 141 136 140 140  K 3.7   < > 3.7 3.4* 3.4* 3.7 3.5  CL 107   < > 103 103 101 101 105  CO2 22   < > 29 28 26 30 27   GLUCOSE 125*   < > 108* 116* 108* 112* 114*  BUN 18   < > 19 26* 31* 29* 29*  CREATININE 0.98   < > 1.07 0.96 1.09 1.19 1.07  CALCIUM 8.5*   < > 8.4* 8.9  8.5* 8.7* 8.7*  MG 2.3  --   --   --  2.4 2.3 2.5*  AST 63*   < > 67* 128* 123* 79* 56*  ALT 60*   < > 72* 152* 199* 183* 155*  ALKPHOS 28*   < > 32* 36* 36* 38 36*  BILITOT 0.7   < > 0.6 0.9 0.6 0.6 0.6   < > = values in this interval not displayed.   ------------------------------------------------------------------------------------------------------------------ No results for input(s): CHOL, HDL, LDLCALC, TRIG, CHOLHDL, LDLDIRECT in the last 72 hours.  No results found for: HGBA1C ------------------------------------------------------------------------------------------------------------------ No results for input(s): TSH, T4TOTAL, T3FREE, THYROIDAB in the last 72 hours.  Invalid input(s): FREET3 ------------------------------------------------------------------------------------------------------------------ Recent Labs    08/06/18 0310 08/07/18 0301  FERRITIN 691* 613*    Coagulation profile No results for input(s): INR, PROTIME in the last 168 hours.  No results for input(s): DDIMER in the last 72 hours.  Cardiac Enzymes No results for input(s): CKMB, TROPONINI, MYOGLOBIN in the last 168 hours.  Invalid input(s): CK ------------------------------------------------------------------------------------------------------------------ No results found for: BNP  Inpatient Medications  Scheduled Meds: . Chlorhexidine Gluconate Cloth  6 each Topical Daily  . enoxaparin (LOVENOX) injection  60 mg Subcutaneous Daily  . furosemide  40 mg Intravenous Daily  . loratadine  10 mg Oral Daily  . mouth rinse  15 mL Mouth Rinse BID  . sodium chloride flush  3 mL Intravenous Q12H  . tamsulosin  0.4 mg Oral Daily  . vitamin C  1,000 mg Oral Daily   Continuous Infusions: . sodium chloride     PRN Meds:.sodium chloride, acetaminophen, albuterol, calcium carbonate, cyclobenzaprine, loperamide, sodium chloride, sodium chloride flush, traMADol  Micro Results Recent Results (from the  past 240 hour(s))  MRSA PCR Screening     Status: None   Collection Time: 08/01/18 10:06 AM  Result Value Ref Range Status   MRSA by PCR NEGATIVE NEGATIVE Final    Comment:        The GeneXpert MRSA Assay (FDA approved for NASAL specimens only), is one component of a comprehensive MRSA colonization surveillance program. It is not intended to diagnose MRSA infection nor to guide or monitor treatment for MRSA infections. Performed at [facility], [address] [place], [place] [address]     Radiology Reports Dg Chest Port 1 View  Result Date: 08/02/2018 CLINICAL DATA:  Worsening hypoxia. COVID-19 virus infection. EXAM: PORTABLE CHEST 1 VIEW COMPARISON:  07/31/2018 FINDINGS: Heart size is within normal limits. Low lung volumes are again seen. Multifocal patchy airspace disease  in both lungs shows mild worsening since previous study. No evidence of pleural effusion. IMPRESSION: Mild worsening of multifocal bilateral airspace disease. Electronically Signed   By: Myles Rosenthal M.D.   On: 08/02/2018 09:51   Dg Chest Portable 1 View  Result Date: 07/31/2018 CLINICAL DATA:  Increasing shortness of breath and positive Covid-19 diagnosis EXAM: PORTABLE CHEST 1 VIEW COMPARISON:  07/27/2018 FINDINGS: Cardiac shadow is enlarged but stable. Increasing bibasilar infiltrates are noted consistent with the patient's given clinical history. No sizable effusion is seen. No bony abnormality is noted. IMPRESSION: Increasing bibasilar infiltrates. Electronically Signed   By: Alcide Clever M.D.   On: 07/31/2018 15:39   Dg Chest Portable 1 View  Result Date: 07/27/2018 CLINICAL DATA:  Bad head cold diagnosed last Friday, worsening cough yesterday, negative for influenza, fever 103.8 degrees this morning, history hypertension, former smoker EXAM: PORTABLE CHEST 1 VIEW COMPARISON:  Portable exam 1041 hours without priors for comparison FINDINGS: Normal heart size, mediastinal contours, and  pulmonary vascularity. Atherosclerotic calcification aorta. Subsegmental atelectasis RIGHT base. Lungs otherwise clear. No infiltrate, pleural effusion or pneumothorax. IMPRESSION: Subsegmental atelectasis RIGHT lung base. Electronically Signed   By: Ulyses Southward M.D.   On: 07/27/2018 11:02      Huey Bienenstock M.D on 08/08/2018 at 1:28 PM  Between 7am to 7pm - Pager - [phone]  After 7pm go to [email or web address] - password [facility]  [facility] -  Office  [phone]

## 2018-08-09 MED ORDER — POTASSIUM CHLORIDE CRYS ER 20 MEQ PO TBCR
40.0000 meq | EXTENDED_RELEASE_TABLET | Freq: Once | ORAL | Status: AC
  Administered 2018-08-09: 40 meq via ORAL
  Filled 2018-08-09: qty 2

## 2018-08-09 NOTE — Progress Notes (Signed)
PROGRESS NOTE                                                                                                                                                                                                             Patient Demographics:    Kenneth Briggs, is a 53 y.o. male, DOB - [birth year], HQI:[id number]  Admit date - 07/31/2018   Admitting Physician [name], MD  Outpatient Primary MD for the patient is System, Pcp Not In  LOS - 9   No chief complaint on file.      Brief Narrative    52 y.o.malewith medical history significant forhtn, obesity, who presented with complains of fever and shortness of breath.  Symptoms began about 1 week prior to hospitalization.  Patient was evaluated in urgent care.  COVID-19 test was ordered.  Since he was stable he was asked to self isolate at home.  Has results to come back positive.  Subsequently he developed more shortness of breath and so decided to come into the hospital at the recommendation of the health department.  Patient's oxygen requirements continued to climb.  Patient was started on hydroxychloroquine.  Subsequently transferred to the intensive care unit.  He was also given Actemra, patient was transferred back to medical floor on 08/06/2018, as he did not require any further high flow nasal cannula.   Subjective:    Kenneth Briggs today ports his dyspnea has improved, to ambulate in his room without oxygen, denies any chest pain, cough .   Assessment  & Plan :    Principal Problem:   COVID-19 virus infection Active Problems:   Essential hypertension   Obesity   BPH (benign prostatic hyperplasia)   Abscess of groin, right   Acute respiratory disease due to COVID-19 virus   Hypoxemia   Acute pulmonary edema (HCC)   Acute respiratory failure with hypoxia secondary to COVID-19 -Patient had increased oxygen requirement, he was transferred to ICU for 07/31/2018, required at 1.7 L high  flow nasal cannula. -He was treated for 5 days of Plaquenil . -He was treated with azithromycin . -PCCM consult greatly appreciated, he did receive Actemra -CRP has normalized, LDH and ferritin trending down -And has diuresed very well, he is slightly tachycardic today, blood pressure on the soft side, I will DC his IV Lasix. -Respiratory status significantly  improved, this morning he was weaned to no oxygen, remains tachycardic on exertion  History of groin abscess Apparently present for a month.  Apparently has sinus tracts draining small amount of drainage.  Even though there is mild erythema patient states that this is been present for a while.  Local wound care.  No changes per patient.  Transaminitis -  LFTs are trending down, has increased after Actemra,  Essential hypertension Blood pressure remains soft, continue to hold valsartan, I have stopped his IV diuresis  History of BPH Stable.  Hypokalemia - Repleted    Code Status : Partial  Family Communication  : Discussed with wife  Disposition Plan  : Home when stable  Consults  :  PCCM  Procedures  : None  DVT Prophylaxis  :  [place] lovenox  Lab Results  Component Value Date   PLT 370 08/08/2018    Antibiotics  :    Anti-infectives (From admission, onward)   Start     Dose/Rate Route Frequency Ordered Stop   08/03/18 2200  hydroxychloroquine (PLAQUENIL) tablet 200 mg  Status:  Discontinued     200 mg Oral 2 times daily 08/02/18 1738 08/02/18 1744   08/02/18 2200  hydroxychloroquine (PLAQUENIL) tablet 400 mg  Status:  Discontinued     400 mg Oral 2 times daily 08/02/18 1738 08/02/18 1744   08/01/18 2200  hydroxychloroquine (PLAQUENIL) tablet 200 mg     200 mg Oral 2 times daily 07/31/18 2213 08/05/18 0809   07/31/18 2215  hydroxychloroquine (PLAQUENIL) tablet 400 mg     400 mg Oral 2 times daily 07/31/18 2213 08/01/18 0958        Objective:   Vitals:   08/09/18 0100 08/09/18 0743 08/09/18 0838 08/09/18  1032  BP:      Pulse: 71     Resp:      Temp:      TempSrc:      SpO2:  93% 94% 93%  Weight:      Height:        Wt Readings from Last 3 Encounters:  08/06/18 119.7 kg  07/31/18 127 kg  07/27/18 127 kg     Intake/Output Summary (Last 24 hours) at 08/09/2018 1154 Last data filed at 08/09/2018 0200 Gross per 24 hour  Intake 240 ml  Output 200 ml  Net 40 ml     Physical Exam  Awake Alert, Oriented X 3, No new F.N deficits, Normal affect Symmetrical Chest wall movement, Good air movement bilaterally, CTAB RRR,No Gallops,Rubs or new Murmurs, No Parasternal Heave +ve B.Sounds, Abd Soft, No tenderness, No rebound - guarding or rigidity. No Cyanosis, Clubbing or edema, No new Rash or bruise       PPE:  - 08/09/2018 -Patient isolation: droplets+ contact - wearing all recommended PPE: CAPR, gloves, surgicalcap.    Data Review:    CBC Recent Labs  Lab 08/03/18 0341 08/04/18 0301 08/05/18 0321 08/06/18 0310 08/07/18 0301 08/08/18 0422  WBC 6.1 6.0 6.2 6.4 7.5 6.7  HGB 12.2* 13.2 13.6 13.6 13.8 13.8  HCT 37.1* 40.9 40.6 41.8 41.4 42.1  PLT 275 326 370 365 385 370  MCV 87.7 86.8 85.1 87.1 86.1 86.4  MCH 28.8 28.0 28.5 28.3 28.7 28.3  MCHC 32.9 32.3 33.5 32.5 33.3 32.8  RDW 13.6 13.3 12.9 13.1 13.0 13.2  LYMPHSABS 1.2 1.2 1.1  --   --   --   MONOABS 0.3 0.4 0.5  --   --   --  EOSABS 0.0 0.1 0.1  --   --   --   BASOSABS 0.1 0.1 0.1  --   --   --     Chemistries  Recent Labs  Lab 08/04/18 0301 08/05/18 0321 08/06/18 0310 08/07/18 0301 08/08/18 0422  NA 141 141 136 140 140  K 3.7 3.4* 3.4* 3.7 3.5  CL 103 103 101 101 105  CO2 29 28 26 30 27   GLUCOSE 108* 116* 108* 112* 114*  BUN 19 26* 31* 29* 29*  CREATININE 1.07 0.96 1.09 1.19 1.07  CALCIUM 8.4* 8.9 8.5* 8.7* 8.7*  MG  --   --  2.4 2.3 2.5*  AST 67* 128* 123* 79* 56*  ALT 72* 152* 199* 183* 155*  ALKPHOS 32* 36* 36* 38 36*  BILITOT 0.6 0.9 0.6 0.6 0.6    ------------------------------------------------------------------------------------------------------------------ No results for input(s): CHOL, HDL, LDLCALC, TRIG, CHOLHDL, LDLDIRECT in the last 72 hours.  No results found for: HGBA1C ------------------------------------------------------------------------------------------------------------------ No results for input(s): TSH, T4TOTAL, T3FREE, THYROIDAB in the last 72 hours.  Invalid input(s): FREET3 ------------------------------------------------------------------------------------------------------------------ Recent Labs    08/07/18 0301  FERRITIN 613*    Coagulation profile No results for input(s): INR, PROTIME in the last 168 hours.  No results for input(s): DDIMER in the last 72 hours.  Cardiac Enzymes No results for input(s): CKMB, TROPONINI, MYOGLOBIN in the last 168 hours.  Invalid input(s): CK ------------------------------------------------------------------------------------------------------------------ No results found for: BNP  Inpatient Medications  Scheduled Meds: . Chlorhexidine Gluconate Cloth  6 each Topical Daily  . enoxaparin (LOVENOX) injection  60 mg Subcutaneous Daily  . loratadine  10 mg Oral Daily  . mouth rinse  15 mL Mouth Rinse BID  . potassium chloride  40 mEq Oral Once  . sodium chloride flush  3 mL Intravenous Q12H  . tamsulosin  0.4 mg Oral Daily  . vitamin C  1,000 mg Oral Daily   Continuous Infusions: . sodium chloride     PRN Meds:.sodium chloride, acetaminophen, albuterol, calcium carbonate, cyclobenzaprine, loperamide, sodium chloride, sodium chloride flush, traMADol  Micro Results Recent Results (from the past 240 hour(s))  MRSA PCR Screening     Status: None   Collection Time: 08/01/18 10:06 AM  Result Value Ref Range Status   MRSA by PCR NEGATIVE NEGATIVE Final    Comment:        The GeneXpert MRSA Assay (FDA approved for NASAL specimens only), is one component of a  comprehensive MRSA colonization surveillance program. It is not intended to diagnose MRSA infection nor to guide or monitor treatment for MRSA infections. Performed at [facility], [address] [place], [place] [id number]     Radiology Reports Dg Chest Port 1 View  Result Date: 08/02/2018 CLINICAL DATA:  Worsening hypoxia. COVID-19 virus infection. EXAM: PORTABLE CHEST 1 VIEW COMPARISON:  07/31/2018 FINDINGS: Heart size is within normal limits. Low lung volumes are again seen. Multifocal patchy airspace disease in both lungs shows mild worsening since previous study. No evidence of pleural effusion. IMPRESSION: Mild worsening of multifocal bilateral airspace disease. Electronically Signed   By: Myles RosenthalJohn  Stahl M.D.   On: 08/02/2018 09:51   Dg Chest Portable 1 View  Result Date: 07/31/2018 CLINICAL DATA:  Increasing shortness of breath and positive Covid-19 diagnosis EXAM: PORTABLE CHEST 1 VIEW COMPARISON:  07/27/2018 FINDINGS: Cardiac shadow is enlarged but stable. Increasing bibasilar infiltrates are noted consistent with the patient's given clinical history. No sizable effusion is seen. No bony abnormality is noted. IMPRESSION: Increasing  bibasilar infiltrates. Electronically Signed   By: Alcide Clever M.D.   On: 07/31/2018 15:39   Dg Chest Portable 1 View  Result Date: 07/27/2018 CLINICAL DATA:  Bad head cold diagnosed last Friday, worsening cough yesterday, negative for influenza, fever 103.8 degrees this morning, history hypertension, former smoker EXAM: PORTABLE CHEST 1 VIEW COMPARISON:  Portable exam 1041 hours without priors for comparison FINDINGS: Normal heart size, mediastinal contours, and pulmonary vascularity. Atherosclerotic calcification aorta. Subsegmental atelectasis RIGHT base. Lungs otherwise clear. No infiltrate, pleural effusion or pneumothorax. IMPRESSION: Subsegmental atelectasis RIGHT lung base. Electronically Signed   By: Ulyses Southward M.D.   On:  07/27/2018 11:02      Huey Bienenstock M.D on 08/09/2018 at 11:54 AM  Between 7am to 7pm - Pager - [phone]  After 7pm go to [email or web address] - password [facility]  [facility] -  Office  [phone]

## 2018-08-10 LAB — CBC
HCT: 43 % (ref 39.0–52.0)
Hemoglobin: 13.7 g/dL (ref 13.0–17.0)
MCH: 28.1 pg (ref 26.0–34.0)
MCHC: 31.9 g/dL (ref 30.0–36.0)
MCV: 88.3 fL (ref 80.0–100.0)
Platelets: 406 10*3/uL — ABNORMAL HIGH (ref 150–400)
RBC: 4.87 MIL/uL (ref 4.22–5.81)
RDW: 13.4 % (ref 11.5–15.5)
WBC: 6.2 10*3/uL (ref 4.0–10.5)
nRBC: 0 % (ref 0.0–0.2)

## 2018-08-10 LAB — BASIC METABOLIC PANEL
Anion gap: 9 (ref 5–15)
BUN: 27 mg/dL — ABNORMAL HIGH (ref 6–20)
CO2: 27 mmol/L (ref 22–32)
Calcium: 9.1 mg/dL (ref 8.9–10.3)
Chloride: 103 mmol/L (ref 98–111)
Creatinine, Ser: 1.3 mg/dL — ABNORMAL HIGH (ref 0.61–1.24)
GFR calc Af Amer: >60 mL/min (ref >60)
GFR calc non Af Amer: >60 mL/min (ref >60)
Glucose, Bld: 105 mg/dL — ABNORMAL HIGH (ref 70–99)
Potassium: 4.1 mmol/L (ref 3.5–5.1)
Sodium: 139 mmol/L (ref 135–145)

## 2018-08-10 MED ORDER — ZINC GLUCONATE 50 MG PO TABS: 50.0000 mg | ORAL_TABLET | Freq: Every day | ORAL | 0 refills | Status: AC

## 2018-08-10 MED ORDER — VITAMIN C 250 MG PO TABS: 250.0000 mg | ORAL_TABLET | Freq: Three times a day (TID) | ORAL | Status: AC

## 2018-08-10 NOTE — Discharge Instructions (Signed)
Person Under Monitoring Name: Kenneth Briggs  Location: [address] [place] [address]   Infection Prevention Recommendations for Individuals Confirmed to have, or Being Evaluated for, 2019 Novel Coronavirus (COVID-19) Infection Who Receive Care at Home  Individuals who are confirmed to have, or are being evaluated for, COVID-19 should follow the prevention steps below until a healthcare provider or local or state health department says they can return to normal activities.  Stay home except to get medical care You should restrict activities outside your home, except for getting medical care. Do not go to work, school, or public areas, and do not use public transportation or taxis.  Call ahead before visiting your doctor Before your medical appointment, call the healthcare provider and tell them that you have, or are being evaluated for, COVID-19 infection. This will help the healthcare providers office take steps to keep other people from getting infected. Ask your healthcare provider to call the local or state health department.  Monitor your symptoms Seek prompt medical attention if your illness is worsening (e.g., difficulty breathing). Before going to your medical appointment, call the healthcare provider and tell them that you have, or are being evaluated for, COVID-19 infection. Ask your healthcare provider to call the local or state health department.  Wear a facemask You should wear a facemask that covers your nose and mouth when you are in the same room with other people and when you visit a healthcare provider. People who live with or visit you should also wear a facemask while they are in the same room with you.  Separate yourself from other people in your home As much as possible, you should stay in a different room from other people in your home. Also, you should use a separate bathroom, if available.  Avoid sharing household items You should not share  dishes, drinking glasses, cups, eating utensils, towels, bedding, or other items with other people in your home. After using these items, you should wash them thoroughly with soap and water.  Cover your coughs and sneezes Cover your mouth and nose with a tissue when you cough or sneeze, or you can cough or sneeze into your sleeve. Throw used tissues in a lined trash can, and immediately wash your hands with soap and water for at least 20 seconds or use an alcohol-based hand rub.  Wash your [employer] your hands often and thoroughly with soap and water for at least 20 seconds. You can use an alcohol-based hand sanitizer if soap and water are not available and if your hands are not visibly dirty. Avoid touching your eyes, nose, and mouth with unwashed hands.   Prevention Steps for Caregivers and Household Members of Individuals Confirmed to have, or Being Evaluated for, COVID-19 Infection Being Cared for in the Home  If you live with, or provide care at home for, a person confirmed to have, or being evaluated for, COVID-19 infection please follow these guidelines to prevent infection:  Follow healthcare providers instructions Make sure that you understand and can help the patient follow any healthcare provider instructions for all care.  Provide for the patients basic needs You should help the patient with basic needs in the home and provide support for getting groceries, prescriptions, and other personal needs.  Monitor the patients symptoms If they are getting sicker, call his or her medical provider and tell them that the patient has, or is being evaluated for, COVID-19 infection. This will help the healthcare providers office  take steps to keep other people from getting infected. Ask the healthcare provider to call the local or state health department.  Limit the number of people who have contact with the patient  If possible, have only one caregiver for the patient.  Other  household members should stay in another home or place of residence. If this is not possible, they should stay  in another room, or be separated from the patient as much as possible. Use a separate bathroom, if available.  Restrict visitors who do not have an essential need to be in the home.  Keep older adults, very young children, and other sick people away from the patient Keep older adults, very young children, and those who have compromised immune systems or chronic health conditions away from the patient. This includes people with chronic heart, lung, or kidney conditions, diabetes, and cancer.  Ensure good ventilation Make sure that shared spaces in the home have good air flow, such as from an air conditioner or an opened window, weather permitting.  Wash your hands often  Wash your hands often and thoroughly with soap and water for at least 20 seconds. You can use an alcohol based hand sanitizer if soap and water are not available and if your hands are not visibly dirty.  Avoid touching your eyes, nose, and mouth with unwashed hands.  Use disposable paper towels to dry your hands. If not available, use dedicated cloth towels and replace them when they become wet.  Wear a facemask and gloves  Wear a disposable facemask at all times in the room and gloves when you touch or have contact with the patients blood, body fluids, and/or secretions or excretions, such as sweat, saliva, sputum, nasal mucus, vomit, urine, or feces.  Ensure the mask fits over your nose and mouth tightly, and do not touch it during use.  Throw out disposable facemasks and gloves after using them. Do not reuse.  Wash your hands immediately after removing your facemask and gloves.  If your personal clothing becomes contaminated, carefully remove clothing and launder. Wash your hands after handling contaminated clothing.  Place all used disposable facemasks, gloves, and other waste in a lined container before  disposing them with other household waste.  Remove gloves and wash your hands immediately after handling these items.  Do not share dishes, glasses, or other household items with the patient  Avoid sharing household items. You should not share dishes, drinking glasses, cups, eating utensils, towels, bedding, or other items with a patient who is confirmed to have, or being evaluated for, COVID-19 infection.  After the person uses these items, you should wash them thoroughly with soap and water.  Wash laundry thoroughly  Immediately remove and wash clothes or bedding that have blood, body fluids, and/or secretions or excretions, such as sweat, saliva, sputum, nasal mucus, vomit, urine, or feces, on them.  Wear gloves when handling laundry from the patient.  Read and follow directions on labels of laundry or clothing items and detergent. In general, wash and dry with the warmest temperatures recommended on the label.  Clean all areas the individual has used often  Clean all touchable surfaces, such as counters, tabletops, doorknobs, bathroom fixtures, toilets, phones, keyboards, tablets, and bedside tables, every day. Also, clean any surfaces that may have blood, body fluids, and/or secretions or excretions on them.  Wear gloves when cleaning surfaces the patient has come in contact with.  Use a diluted bleach solution (e.g., dilute bleach with 1 part  bleach and 10 parts water) or a household disinfectant with a label that says EPA-registered for coronaviruses. To make a bleach solution at home, add 1 tablespoon of bleach to 1 quart (4 cups) of water. For a larger supply, add  cup of bleach to 1 gallon (16 cups) of water.  Read labels of cleaning products and follow recommendations provided on product labels. Labels contain instructions for safe and effective use of the cleaning product including precautions you should take when applying the product, such as wearing gloves or eye protection  and making sure you have good ventilation during use of the product.  Remove gloves and wash hands immediately after cleaning.  Monitor yourself for signs and symptoms of illness Caregivers and household members are considered close contacts, should monitor their health, and will be asked to limit movement outside of the home to the extent possible. Follow the monitoring steps for close contacts listed on the symptom monitoring form.   ? If you have additional questions, contact your local health department or call the epidemiologist on call at [phone] (available 24/7). ? This guidance is subject to change. For the most up-to-date guidance from [facility], please refer to their website: [email or web address]

## 2018-08-10 NOTE — Discharge Summary (Signed)
Kenneth Briggs, is a 53 y.o. male  DOB 01-09-1966  MRN [id number].  Admission date:  07/31/2018  Admitting Physician  [name], MD  Discharge Date:  08/10/2018   Primary MD  System, Pcp Not In  Recommendations for primary care physician for things to follow:  -Patient was instructed to follow [facility] Department of Health guidelines for isolation for COVID-19, instruction were given.  Admission Diagnosis  COVID-19 SOB   Discharge Diagnosis  COVID-19 SOB   Principal Problem:   COVID-19 virus infection Active Problems:   Essential hypertension   Obesity   BPH (benign prostatic hyperplasia)   Abscess of groin, right   Acute respiratory disease due to COVID-19 virus   Hypoxemia   Acute pulmonary edema (HCC)      Past Medical History:  Diagnosis Date   Enlarged prostate    Hypertension     Past Surgical History:  Procedure Laterality Date   VASECTOMY         History of present illness and  Hospital Course:     Kindly see H&P for history of present illness and admission details, please review complete Labs, Consult reports and Test reports for all details in brief  HPI  from the history and physical done on the day of admission 07/31/2018  HPI: Kenneth Briggs is a 53 y.o. male with medical history significant for htn, obesity, who presents with above.  No known covid contacts or recent travel.  Symptoms began one week ago. Began with fever, then developed occasional loose stools, cough, shortness of breath, a feeling of tightness in the chest worse with deep inspiration, headache, and body aches. This has progressively worsened, worse over past 1-2 days. Initially did a telehealth visit with his pcp, was told likely viral illness, with symptomatic mgmt. Then went to a [facility] urgent care, flu negative, but was prescribed tamiflu and azithromycin. Symptoms continued  to progress. On 3/30 presented to [facility] ED, where covid test was performed. Clinically relatively well appearing, so discharged home. covid test has returned positive. When his [place] health department called and spoke with him today (they had been calling daily since 3/30 covid test), his symptoms (mainly shortness of breath) had worsened such that they advised return to [facility] ED.  Pt also notes that about a month ago diagnosed with right groin abscess at pcp's. Has been draining. Completed a course of antibiotics. Says continues to drain but has continued to improve, now much improved from where things started.   In the ED requiring 2-3 L [place] O2, mild tachypnea.   ED Course: cxr, labs   Hospital Course   52 y.o.malewith medical history significant forhtn, obesity, who presented with complains of fever and shortness of breath. Symptoms began about 1 week prior to hospitalization. Patient was evaluated in urgent care. COVID-19 test was ordered. Since he was stable he was asked to self isolate at home. Has results to come back positive. Subsequently he developed more shortness of breath and so decided to  come into the hospital at the recommendation of the health department. Patient's oxygen requirements continued to climb. Patient was started on hydroxychloroquine. Subsequently transferred to the intensive care unit. He was also given Actemra, patient was transferred back to medical floor on 08/06/2018, as he did not require any further high flow nasal cannula.  Acute respiratory failure with hypoxia secondary to COVID-19 -Patient had increased oxygen requirement, he was transferred to ICU for 07/31/2018,  his maximum requirement was 7 L high flow nasal cannula, this currently has resolved, tolerating room air from yesterday  -He was treated for 5 days of Plaquenil . -He was treated with azithromycin . -PCCM consult greatly appreciated, he did receive Actemra -CRP has normalized, LDH and  ferritin trending down - has diuresed very well, no further diuresis on discharge, actually he does appear to be a little bit on the dry side  History of groin abscess Apparently present for a month. Apparently has sinus tracts draining small amount of drainage. Even though there is mild erythema patient states that this is been present for a while. Local wound care. No changes per patient.  Transaminitis -  LFTs are trending down, has increased after Actemra,  Essential hypertension Blood pressure remains soft, continue to hold valsartan on discharge  History of BPH Stable.  Hypokalemia - Repleted   Discharge Condition:   STABLE        Discharge Instructions  and  Discharge Medications    Discharge Instructions    Discharge instructions   Complete by:  As directed    Follow with Primary MD System, Pcp Not In in 7 days   Get CBC, CMP, checked  by Primary MD next visit.    Activity: As tolerated with Full fall precautions use walker/cane & assistance as needed   Disposition Home    Diet: Heart Healthy , with feeding assistance and aspiration precautions.   On your next visit with your primary care physician please Get Medicines reviewed and adjusted.   Please request your Prim.MD to go over all Hospital Tests and Procedure/Radiological results at the follow up, please get all Hospital records sent to your Prim MD by signing hospital release before you go home.   If you experience worsening of your admission symptoms, develop shortness of breath, life threatening emergency, suicidal or homicidal thoughts you must seek medical attention immediately by calling 911 or calling your MD immediately  if symptoms less severe.  You Must read complete instructions/literature along with all the possible adverse reactions/side effects for all the Medicines you take and that have been prescribed to you. Take any new Medicines after you have completely understood and  accpet all the possible adverse reactions/side effects.   Do not drive, operating heavy machinery, perform activities at heights, swimming or participation in water activities or provide baby sitting services if your were admitted for syncope or siezures until you have seen by Primary MD or a Neurologist and advised to do so again.  Do not drive when taking Pain medications.    Do not take more than prescribed Pain, Sleep and Anxiety Medications  Special Instructions: If you have smoked or chewed Tobacco  in the last 2 yrs please stop smoking, stop any regular Alcohol  and or any Recreational drug use.  Wear Seat belts while driving.   Please note  You were cared for by a hospitalist during your hospital stay. If you have any questions about your discharge medications or the care you received while you  were in the hospital after you are discharged, you can call the unit and asked to speak with the hospitalist on call if the hospitalist that took care of you is not available. Once you are discharged, your primary care physician will handle any further medical issues. Please note that NO REFILLS for any discharge medications will be authorized once you are discharged, as it is imperative that you return to your primary care physician (or establish a relationship with a primary care physician if you do not have one) for your aftercare needs so that they can reassess your need for medications and monitor your lab values.   Increase activity slowly   Complete by:  As directed      Allergies as of 08/10/2018      Reactions   Lisinopril Cough      Medication List    STOP taking these medications   valsartan 160 MG tablet Commonly known as:  DIOVAN     TAKE these medications   benzonatate 200 MG capsule Commonly known as:  TESSALON Take 200 mg by mouth 3 (three) times daily as needed for cough.   cetirizine 10 MG tablet Commonly known as:  ZYRTEC Take 10 mg by mouth at bedtime.   [employer]  D3 50 MCG (2000 UT) Caps Generic drug:  Cholecalciferol Take 3 capsules by mouth daily.   CVS VITAMIN B12 1000 MCG tablet Generic drug:  cyanocobalamin Take 1,000 mcg by mouth daily.   guaiFENesin 600 MG 12 hr tablet Commonly known as:  MUCINEX Take 600 mg by mouth 2 (two) times daily.   tamsulosin 0.4 MG Caps capsule Commonly known as:  FLOMAX Take 0.4 mg by mouth daily.   vitamin C 250 MG tablet Commonly known as:  ASCORBIC ACID Take 1 tablet (250 mg total) by mouth 3 (three) times daily for 10 days.   zinc gluconate 50 MG tablet Take 1 tablet (50 mg total) by mouth daily for 10 days.         Diet and Activity recommendation: See Discharge Instructions above   Consults obtained - PCCM   Major procedures and Radiology Reports - PLEASE review detailed and final reports for all details, in brief -     Dg Chest Port 1 View  Result Date: 08/02/2018 CLINICAL DATA:  Worsening hypoxia. COVID-19 virus infection. EXAM: PORTABLE CHEST 1 VIEW COMPARISON:  07/31/2018 FINDINGS: Heart size is within normal limits. Low lung volumes are again seen. Multifocal patchy airspace disease in both lungs shows mild worsening since previous study. No evidence of pleural effusion. IMPRESSION: Mild worsening of multifocal bilateral airspace disease. Electronically Signed   By: Myles Rosenthal M.D.   On: 08/02/2018 09:51   Dg Chest Portable 1 View  Result Date: 07/31/2018 CLINICAL DATA:  Increasing shortness of breath and positive Covid-19 diagnosis EXAM: PORTABLE CHEST 1 VIEW COMPARISON:  07/27/2018 FINDINGS: Cardiac shadow is enlarged but stable. Increasing bibasilar infiltrates are noted consistent with the patient's given clinical history. No sizable effusion is seen. No bony abnormality is noted. IMPRESSION: Increasing bibasilar infiltrates. Electronically Signed   By: Alcide Clever M.D.   On: 07/31/2018 15:39   Dg Chest Portable 1 View  Result Date: 07/27/2018 CLINICAL DATA:  Bad head cold  diagnosed last Friday, worsening cough yesterday, negative for influenza, fever 103.8 degrees this morning, history hypertension, former smoker EXAM: PORTABLE CHEST 1 VIEW COMPARISON:  Portable exam 1041 hours without priors for comparison FINDINGS: Normal heart size, mediastinal contours, and pulmonary vascularity. Atherosclerotic  calcification aorta. Subsegmental atelectasis RIGHT base. Lungs otherwise clear. No infiltrate, pleural effusion or pneumothorax. IMPRESSION: Subsegmental atelectasis RIGHT lung base. Electronically Signed   By: Ulyses Southward M.D.   On: 07/27/2018 11:02    Micro Results     Recent Results (from the past 240 hour(s))  MRSA PCR Screening     Status: None   Collection Time: 08/01/18 10:06 AM  Result Value Ref Range Status   MRSA by PCR NEGATIVE NEGATIVE Final    Comment:        The GeneXpert MRSA Assay (FDA approved for NASAL specimens only), is one component of a comprehensive MRSA colonization surveillance program. It is not intended to diagnose MRSA infection nor to guide or monitor treatment for MRSA infections. Performed at [facility], [address] [place], [place] [address]        Today   Subjective:   Braedyn Kauk today has no headache,no chest abdominal pain,no new weakness tingling or numbness, feels much better wants to go home today.   Objective:   Blood pressure 95/60, pulse 79, temperature 97.8 F (36.6 C), temperature source Oral, resp. rate 20, height  (1.778 m), weight 119.7 kg, SpO2 93 %.   Intake/Output Summary (Last 24 hours) at 08/10/2018 1146 Last data filed at 08/09/2018 2156 Gross per 24 hour  Intake 480 ml  Output 825 ml  Net -345 ml    Exam Awake Alert, Oriented x 3, No new F.N deficits, Normal affect Symmetrical Chest wall movement, Good air movement bilaterally, CTAB RRR,No Gallops,Rubs or new Murmurs, No Parasternal Heave +ve B.Sounds, Abd Soft, Non tender,No rebound -guarding or  rigidity. No Cyanosis, Clubbing or edema, No new Rash or bruise  Data Review   CBC w Diff:  Lab Results  Component Value Date   WBC 6.2 08/10/2018   HGB 13.7 08/10/2018   HCT 43.0 08/10/2018   PLT 406 (H) 08/10/2018   LYMPHOPCT 18 08/05/2018   MONOPCT 8 08/05/2018   EOSPCT 2 08/05/2018   BASOPCT 1 08/05/2018    CMP:  Lab Results  Component Value Date   NA 139 08/10/2018   K 4.1 08/10/2018   CL 103 08/10/2018   CO2 27 08/10/2018   BUN 27 (H) 08/10/2018   CREATININE 1.30 (H) 08/10/2018   PROT 6.5 08/08/2018   ALBUMIN 3.0 (L) 08/08/2018   BILITOT 0.6 08/08/2018   ALKPHOS 36 (L) 08/08/2018   AST 56 (H) 08/08/2018   ALT 155 (H) 08/08/2018  .   Total Time in preparing paper work, data evaluation and todays exam - 35 minutes  Huey Bienenstock M.D on 08/10/2018 at 11:46 AM  [facility]   Office  [phone]

## 2018-09-16 DIAGNOSIS — F411 Generalized anxiety disorder: Secondary | ICD-10-CM | POA: Insufficient documentation

## 2018-12-31 DIAGNOSIS — I351 Nonrheumatic aortic (valve) insufficiency: Secondary | ICD-10-CM | POA: Insufficient documentation

## 2019-08-03 ENCOUNTER — Other Ambulatory Visit: Payer: Self-pay | Admitting: Family Medicine

## 2019-08-03 ENCOUNTER — Other Ambulatory Visit: Payer: Self-pay

## 2019-08-03 ENCOUNTER — Ambulatory Visit
Admission: RE | Admit: 2019-08-03 | Discharge: 2019-08-03 | Disposition: A | Discharge status: Home / Self Care | Payer: Worker's Compensation | Source: Ambulatory Visit | Attending: Family Medicine | Admitting: Family Medicine

## 2019-08-03 ENCOUNTER — Ambulatory Visit
Admission: RE | Admit: 2019-08-03 | Discharge: 2019-08-03 | Disposition: A | Discharge status: Home / Self Care | Payer: Worker's Compensation | Attending: Family Medicine | Admitting: Family Medicine

## 2019-08-03 DIAGNOSIS — R52 Pain, unspecified: Secondary | ICD-10-CM | POA: Insufficient documentation

## 2020-01-04 ENCOUNTER — Emergency Department: Payer: BC Managed Care – PPO

## 2020-01-04 ENCOUNTER — Emergency Department
Admission: EM | Admit: 2020-01-04 | Discharge: 2020-01-04 | Payer: BC Managed Care – PPO | Attending: Emergency Medicine | Admitting: Emergency Medicine

## 2020-01-04 ENCOUNTER — Other Ambulatory Visit: Payer: Self-pay

## 2020-01-04 ENCOUNTER — Observation Stay (HOSPITAL_COMMUNITY)
Admission: AD | Admit: 2020-01-04 | Discharge: 2020-01-05 | Disposition: A | Discharge status: Home / Self Care | Payer: BC Managed Care – PPO | Source: Other Acute Inpatient Hospital | Attending: Surgery | Admitting: Surgery

## 2020-01-04 ENCOUNTER — Encounter: Payer: Self-pay | Admitting: Emergency Medicine

## 2020-01-04 DIAGNOSIS — S2242XA Multiple fractures of ribs, left side, initial encounter for closed fracture: Principal | ICD-10-CM | POA: Insufficient documentation

## 2020-01-04 DIAGNOSIS — I10 Essential (primary) hypertension: Secondary | ICD-10-CM | POA: Insufficient documentation

## 2020-01-04 DIAGNOSIS — S50311A Abrasion of right elbow, initial encounter: Secondary | ICD-10-CM | POA: Diagnosis not present

## 2020-01-04 DIAGNOSIS — Y929 Unspecified place or not applicable: Secondary | ICD-10-CM | POA: Diagnosis not present

## 2020-01-04 DIAGNOSIS — Y999 Unspecified external cause status: Secondary | ICD-10-CM | POA: Diagnosis not present

## 2020-01-04 DIAGNOSIS — Y92481 Parking lot as the place of occurrence of the external cause: Secondary | ICD-10-CM | POA: Insufficient documentation

## 2020-01-04 DIAGNOSIS — I719 Aortic aneurysm of unspecified site, without rupture: Secondary | ICD-10-CM | POA: Diagnosis not present

## 2020-01-04 DIAGNOSIS — Z87891 Personal history of nicotine dependence: Secondary | ICD-10-CM | POA: Insufficient documentation

## 2020-01-04 DIAGNOSIS — S50312A Abrasion of left elbow, initial encounter: Secondary | ICD-10-CM | POA: Diagnosis not present

## 2020-01-04 DIAGNOSIS — S299XXA Unspecified injury of thorax, initial encounter: Secondary | ICD-10-CM | POA: Diagnosis present

## 2020-01-04 DIAGNOSIS — Z20822 Contact with and (suspected) exposure to covid-19: Secondary | ICD-10-CM | POA: Insufficient documentation

## 2020-01-04 DIAGNOSIS — Y9389 Activity, other specified: Secondary | ICD-10-CM | POA: Insufficient documentation

## 2020-01-04 DIAGNOSIS — R079 Chest pain, unspecified: Secondary | ICD-10-CM | POA: Diagnosis present

## 2020-01-04 DIAGNOSIS — J939 Pneumothorax, unspecified: Secondary | ICD-10-CM

## 2020-01-04 DIAGNOSIS — J942 Hemothorax: Secondary | ICD-10-CM | POA: Insufficient documentation

## 2020-01-04 DIAGNOSIS — Y939 Activity, unspecified: Secondary | ICD-10-CM | POA: Diagnosis not present

## 2020-01-04 DIAGNOSIS — I7121 Aneurysm of the ascending aorta, without rupture: Secondary | ICD-10-CM

## 2020-01-04 DIAGNOSIS — R52 Pain, unspecified: Secondary | ICD-10-CM

## 2020-01-04 LAB — PROTIME-INR
INR: 1 (ref 0.8–1.2)
Prothrombin Time: 12.9 seconds (ref 11.4–15.2)

## 2020-01-04 LAB — COMPREHENSIVE METABOLIC PANEL
ALT: 52 U/L — ABNORMAL HIGH (ref 0–44)
AST: 47 U/L — ABNORMAL HIGH (ref 15–41)
Albumin: 4.6 g/dL (ref 3.5–5.0)
Alkaline Phosphatase: 33 U/L — ABNORMAL LOW (ref 38–126)
Anion gap: 11 (ref 5–15)
BUN: 19 mg/dL (ref 6–20)
CO2: 23 mmol/L (ref 22–32)
Calcium: 9 mg/dL (ref 8.9–10.3)
Chloride: 104 mmol/L (ref 98–111)
Creatinine, Ser: 1.15 mg/dL (ref 0.61–1.24)
GFR calc Af Amer: >60 mL/min (ref >60)
GFR calc non Af Amer: >60 mL/min (ref >60)
Glucose, Bld: 148 mg/dL — ABNORMAL HIGH (ref 70–99)
Potassium: 4.1 mmol/L (ref 3.5–5.1)
Sodium: 138 mmol/L (ref 135–145)
Total Bilirubin: 0.9 mg/dL (ref 0.3–1.2)
Total Protein: 7.8 g/dL (ref 6.5–8.1)

## 2020-01-04 LAB — APTT: aPTT: 25 seconds (ref 24–36)

## 2020-01-04 LAB — TYPE AND SCREEN
ABO/RH(D): A NEG
Antibody Screen: NEGATIVE

## 2020-01-04 LAB — CBC
HCT: 41.8 % (ref 39.0–52.0)
Hemoglobin: 14.5 g/dL (ref 13.0–17.0)
MCH: 29.1 pg (ref 26.0–34.0)
MCHC: 34.7 g/dL (ref 30.0–36.0)
MCV: 83.9 fL (ref 80.0–100.0)
Platelets: 239 10*3/uL (ref 150–400)
RBC: 4.98 MIL/uL (ref 4.22–5.81)
RDW: 13.4 % (ref 11.5–15.5)
WBC: 8.4 10*3/uL (ref 4.0–10.5)
nRBC: 0 % (ref 0.0–0.2)

## 2020-01-04 LAB — SARS CORONAVIRUS 2 BY RT PCR (HOSPITAL ORDER, PERFORMED IN ~~LOC~~ HOSPITAL LAB): SARS Coronavirus 2: NEGATIVE

## 2020-01-04 MED ORDER — OXYCODONE HCL 5 MG PO TABS
5.0000 mg | ORAL_TABLET | ORAL | Status: DC | PRN
  Administered 2020-01-04 – 2020-01-05 (×2): 5 mg via ORAL
  Filled 2020-01-04 (×2): qty 1

## 2020-01-04 MED ORDER — KETOROLAC TROMETHAMINE 30 MG/ML IJ SOLN
30.0000 mg | Freq: Four times a day (QID) | INTRAMUSCULAR | Status: DC | PRN
  Filled 2020-01-04: qty 1

## 2020-01-04 MED ORDER — IOHEXOL 300 MG/ML  SOLN
100.0000 mL | Freq: Once | INTRAMUSCULAR | Status: AC | PRN
  Administered 2020-01-04: 100 mL via INTRAVENOUS

## 2020-01-04 MED ORDER — KETOROLAC TROMETHAMINE 30 MG/ML IJ SOLN
30.0000 mg | Freq: Four times a day (QID) | INTRAMUSCULAR | Status: DC | PRN
  Administered 2020-01-04 – 2020-01-05 (×2): 30 mg via INTRAVENOUS
  Filled 2020-01-04 (×2): qty 1

## 2020-01-04 MED ORDER — FENTANYL CITRATE (PF) 100 MCG/2ML IJ SOLN
50.0000 ug | Freq: Once | INTRAMUSCULAR | Status: AC
  Administered 2020-01-04: 50 ug via INTRAVENOUS
  Filled 2020-01-04: qty 2

## 2020-01-04 MED ORDER — BISACODYL 10 MG RE SUPP: 10.0000 mg | Freq: Every day | RECTAL | Status: DC | PRN

## 2020-01-04 MED ORDER — HYDRALAZINE HCL 20 MG/ML IJ SOLN: 10.0000 mg | INTRAMUSCULAR | Status: DC | PRN

## 2020-01-04 MED ORDER — MORPHINE SULFATE (PF) 4 MG/ML IV SOLN
4.0000 mg | INTRAVENOUS | Status: DC | PRN
  Administered 2020-01-04 (×2): 4 mg via INTRAVENOUS
  Filled 2020-01-04 (×2): qty 1

## 2020-01-04 MED ORDER — ENOXAPARIN SODIUM 40 MG/0.4ML ~~LOC~~ SOLN
40.0000 mg | SUBCUTANEOUS | Status: DC
  Administered 2020-01-04: 40 mg via SUBCUTANEOUS
  Filled 2020-01-04: qty 0.4

## 2020-01-04 MED ORDER — ONDANSETRON HCL 4 MG/2ML IJ SOLN
4.0000 mg | Freq: Once | INTRAMUSCULAR | Status: AC
  Administered 2020-01-04: 4 mg via INTRAVENOUS
  Filled 2020-01-04: qty 2

## 2020-01-04 MED ORDER — HYDROMORPHONE HCL 1 MG/ML IJ SOLN
1.0000 mg | INTRAMUSCULAR | Status: DC | PRN
  Administered 2020-01-05: 1 mg via INTRAVENOUS
  Filled 2020-01-04 (×2): qty 1

## 2020-01-04 MED ORDER — ONDANSETRON 4 MG PO TBDP: 4.0000 mg | ORAL_TABLET | Freq: Four times a day (QID) | ORAL | Status: DC | PRN

## 2020-01-04 MED ORDER — ONDANSETRON HCL 4 MG/2ML IJ SOLN: 4.0000 mg | Freq: Four times a day (QID) | INTRAMUSCULAR | Status: DC | PRN

## 2020-01-04 NOTE — Progress Notes (Signed)
Received pt alert and oriented x4. Pt states 4/10 pain to rib area. Paged MD assigned for new orders.

## 2020-01-04 NOTE — ED Notes (Signed)
Pt presentation discussed with EDP, Quale; see new orders.
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
## 2020-01-04 NOTE — ED Notes (Signed)
Doug from [facility] informed me of bed assignment  [address] bed 26  will transport after 1900
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
## 2020-01-04 NOTE — ED Notes (Signed)
Called [facility] for transfer [phone]

## 2020-01-05 ENCOUNTER — Observation Stay (HOSPITAL_COMMUNITY): Payer: BC Managed Care – PPO

## 2020-01-05 ENCOUNTER — Encounter (HOSPITAL_COMMUNITY): Payer: Self-pay

## 2020-01-05 DIAGNOSIS — I7121 Aneurysm of the ascending aorta, without rupture: Secondary | ICD-10-CM

## 2020-01-05 DIAGNOSIS — S2242XA Multiple fractures of ribs, left side, initial encounter for closed fracture: Secondary | ICD-10-CM | POA: Diagnosis not present

## 2020-01-05 LAB — HIV ANTIBODY (ROUTINE TESTING W REFLEX): HIV Screen 4th Generation wRfx: NONREACTIVE

## 2020-01-05 MED ORDER — OXYCODONE HCL 5 MG PO TABS
5.0000 mg | ORAL_TABLET | Freq: Four times a day (QID) | ORAL | 0 refills | Status: AC | PRN
Start: 2020-01-05 — End: ?

## 2020-01-05 MED ORDER — CALCIUM CARBONATE ANTACID 500 MG PO CHEW
1.0000 | CHEWABLE_TABLET | Freq: Two times a day (BID) | ORAL | Status: DC | PRN
  Administered 2020-01-05: 200 mg via ORAL
  Filled 2020-01-05: qty 1

## 2020-01-05 MED ORDER — BACITRACIN ZINC 500 UNIT/GM EX OINT
TOPICAL_OINTMENT | Freq: Two times a day (BID) | CUTANEOUS | Status: DC
  Administered 2020-01-05: 31.5 via TOPICAL
  Filled 2020-01-05: qty 28.35

## 2020-01-05 MED ORDER — OXYCODONE HCL 5 MG PO TABS
5.0000 mg | ORAL_TABLET | ORAL | Status: DC | PRN
  Administered 2020-01-05: 10 mg via ORAL
  Administered 2020-01-05: 5 mg via ORAL
  Filled 2020-01-05: qty 1
  Filled 2020-01-05: qty 2

## 2020-01-05 MED ORDER — METHOCARBAMOL 750 MG PO TABS
750.0000 mg | ORAL_TABLET | Freq: Three times a day (TID) | ORAL | Status: DC
  Administered 2020-01-05 (×2): 750 mg via ORAL
  Filled 2020-01-05 (×2): qty 1

## 2020-01-05 MED ORDER — HYDROMORPHONE HCL 1 MG/ML IJ SOLN: 1.0000 mg | INTRAMUSCULAR | Status: DC | PRN

## 2020-01-05 MED ORDER — ACETAMINOPHEN 500 MG PO TABS: 1000.0000 mg | ORAL_TABLET | Freq: Three times a day (TID) | ORAL | 0 refills | Status: AC | PRN

## 2020-01-05 MED ORDER — METHOCARBAMOL 750 MG PO TABS: 750.0000 mg | ORAL_TABLET | Freq: Three times a day (TID) | ORAL | 0 refills | Status: AC | PRN

## 2020-01-05 MED ORDER — ACETAMINOPHEN 500 MG PO TABS
1000.0000 mg | ORAL_TABLET | Freq: Four times a day (QID) | ORAL | Status: DC
  Administered 2020-01-05 (×2): 1000 mg via ORAL
  Filled 2020-01-05 (×2): qty 2

## 2020-01-05 NOTE — Progress Notes (Signed)
Discharge instructions reviewed at bedside with patient who verbalized understanding and declined further education.  Patient awaiting arrival of spouse for transportation home.  Patient to call staff when wheelchair and escort are needed for discharge.
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
## 2020-01-05 NOTE — Progress Notes (Signed)
Patient transported via wheelchair off unit for discharge.

## 2020-01-05 NOTE — Discharge Instructions (Signed)
As we discussed. Your CT showed an incidental finding of a new apparent aneurysmal dilatation of the ascending thoracic aorta measuring 4.1 cm maximal diameter. It is recommend to get a follow-up chest CTA or MRA in 1 year. Please follow up with your primary care provider for this.   RIB FRACTURES  HOME INSTRUCTIONS   1. PAIN CONTROL:  1. Pain is best controlled by a usual combination of three different methods TOGETHER:  i. Ice/Heat ii. Over the counter pain medication iii. Prescription pain medication 2. You may experience some swelling and bruising in area of broken ribs. Ice packs or heating pads (30-60 minutes up to 6 times a day) will help. Use ice for the first few days to help decrease swelling and bruising, then switch to heat to help relax tight/sore spots and speed recovery. Some people prefer to use ice alone, heat alone, alternating between ice & heat. Experiment to what works for you. Swelling and bruising can take several weeks to resolve.  3. It is helpful to take an over-the-counter pain medication regularly for the first few weeks. Choose one of the following that works best for you:  i. Naproxen (Aleve, etc) Two 220mg  tabs twice a day ii. Ibuprofen (Advil, etc) Three 200mg  tabs four times a day (every meal & bedtime) iii. Acetaminophen (Tylenol, etc) 500-650mg  four times a day (every meal & bedtime) 4. A prescription for pain medication (such as oxycodone, hydrocodone, etc) may be given to you upon discharge. Take your pain medication as prescribed.  i. If you are having problems/concerns with the prescription medicine (does not control pain, nausea, vomiting, rash, itching, etc), please call [phone] to see if we need to switch you to a different pain medicine that will work better for you and/or control your side effect better. ii. If you need a refill on your pain medication, please contact your pharmacy. They will contact our office to request authorization.  Prescriptions will not be filled after 5 pm or on week-ends. 1. Avoid getting constipated. When taking pain medications, it is common to experience some constipation. Increasing fluid intake and taking a fiber supplement (such as Metamucil, Citrucel, FiberCon, MiraLax, etc) 1-2 times a day regularly will usually help prevent this problem from occurring. A mild laxative (prune juice, Milk of Magnesia, MiraLax, etc) should be taken according to package directions if there are no bowel movements after 48 hours.  2. Watch out for diarrhea. If you have many loose bowel movements, simplify your diet to bland foods & liquids for a few days. Stop any stool softeners and decrease your fiber supplement. Switching to mild anti-diarrheal medications (Kayopectate, Pepto Bismol) can help. If this worsens or does not improve, please call us. 3. FOLLOW UP  a. If a follow up appointment is needed one will be scheduled for you. If none is needed with our trauma team, please follow up with your primary care provider within 2-3 weeks from discharge. Please call [facility] at [phone] if you have any questions about follow up.  b. If you have any orthopedic or other injuries you will need to follow up as outlined in your follow up instructions.   WHEN TO CALL us [phone]:  1. Poor pain control 2. Reactions / problems with new medications (rash/itching, nausea, etc)  3. Fever over 101.5 F (38.5 C) 4. Worsening swelling or bruising 5. Worsening pain, productive cough, difficulty breathing or any other concerning symptoms  The clinic staff is available to answer  your questions during regular business hours (8:30am-5pm). Please don't hesitate to call and ask to speak to one of our nurses for clinical concerns.  If you have a medical emergency, go to the nearest emergency room or call 911.  A surgeon from [facility] is always on call at the [facility], [place]  [address], [address], [place], [place] [address] ?  MAIN: [phone] ? TOLL FREE: [phone] ?  FAX [phone]  [email or web address]      Information on Rib Fractures  A rib fracture is a break or crack in one of the bones of the ribs. The ribs are long, curved bones that wrap around your chest and attach to your spine and your breastbone. The ribs protect your heart, lungs, and other organs in the chest. A broken or cracked rib is often painful but is not usually serious. Most rib fractures heal on their own over time. However, rib fractures can be more serious if multiple ribs are broken or if broken ribs move out of place and push against other structures or organs. What are the causes? This condition is caused by:  Repetitive movements with high force, such as pitching a baseball or having severe coughing spells.  A direct blow to the chest, such as a sports injury, a car accident, or a fall.  Cancer that has spread to the bones, which can weaken bones and cause them to break. What are the signs or symptoms? Symptoms of this condition include:  Pain when you breathe in or cough.  Pain when someone presses on the injured area.  Feeling short of breath. How is this diagnosed? This condition is diagnosed with a physical exam and medical history. Imaging tests may also be done, such as:  Chest X-ray.  CT scan.  MRI.  Bone scan.  Chest ultrasound. How is this treated? Treatment for this condition depends on the severity of the fracture. Most rib fractures usually heal on their own in 1-3 months. Sometimes healing takes longer if there is a cough that does not stop or if there are other activities that make the injury worse (aggravating factors). While you heal, you will be given medicines to control the pain. You will also be taught deep breathing exercises. Severe injuries may require hospitalization or surgery. Follow these instructions at home: Managing  pain, stiffness, and swelling  If directed, apply ice to the injured area. ? Put ice in a plastic bag. ? Place a towel between your skin and the bag. ? Leave the ice on for 20 minutes, 2-3 times a day.  Take over-the-counter and prescription medicines only as told by your health care provider. Activity  Avoid a lot of activity and any activities or movements that cause pain. Be careful during activities and avoid bumping the injured rib.  Slowly increase your activity as told by your health care provider. General instructions  Do deep breathing exercises as told by your health care provider. This helps prevent pneumonia, which is a common complication of a broken rib. Your health care provider may instruct you to: ? Take deep breaths several times a day. ? Try to cough several times a day, holding a pillow against the injured area. ? Use a device called incentive spirometer to practice deep breathing several times a day.  Drink enough fluid to keep your urine pale yellow.  Do not wear a rib belt or binder. These restrict breathing, which can lead  to pneumonia.  Keep all follow-up visits as told by your health care provider. This is important. Contact a health care provider if:  You have a fever. Get help right away if:  You have difficulty breathing or you are short of breath.  You develop a cough that does not stop, or you cough up thick or bloody sputum.  You have nausea, vomiting, or pain in your abdomen.  Your pain gets worse and medicine does not help. Summary  A rib fracture is a break or crack in one of the bones of the ribs.  A broken or cracked rib is often painful but is not usually serious.  Most rib fractures heal on their own over time.  Treatment for this condition depends on the severity of the fracture.  Avoid a lot of activity and any activities or movements that cause pain. This information is not intended to replace advice given to you by your health  care provider. Make sure you discuss any questions you have with your health care provider. Document Released: 04/15/2005 Document Revised: 07/15/2016 Document Reviewed: 07/15/2016 Elsevier Interactive Patient Education  2019 [employer].
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
## 2020-07-04 IMAGING — DX PORTABLE CHEST - 1 VIEW
1 series · 1 of 1 positions shown · non-contrast
Comparison: Portable exam 6486 hours without priors for comparison

CLINICAL DATA: Bad head cold diagnosed [REDACTED], worsening cough
yesterday, negative for influenza, fever 103.8 degrees this morning,
history hypertension, former smoker

EXAM:
PORTABLE CHEST 1 VIEW

[chest ap]
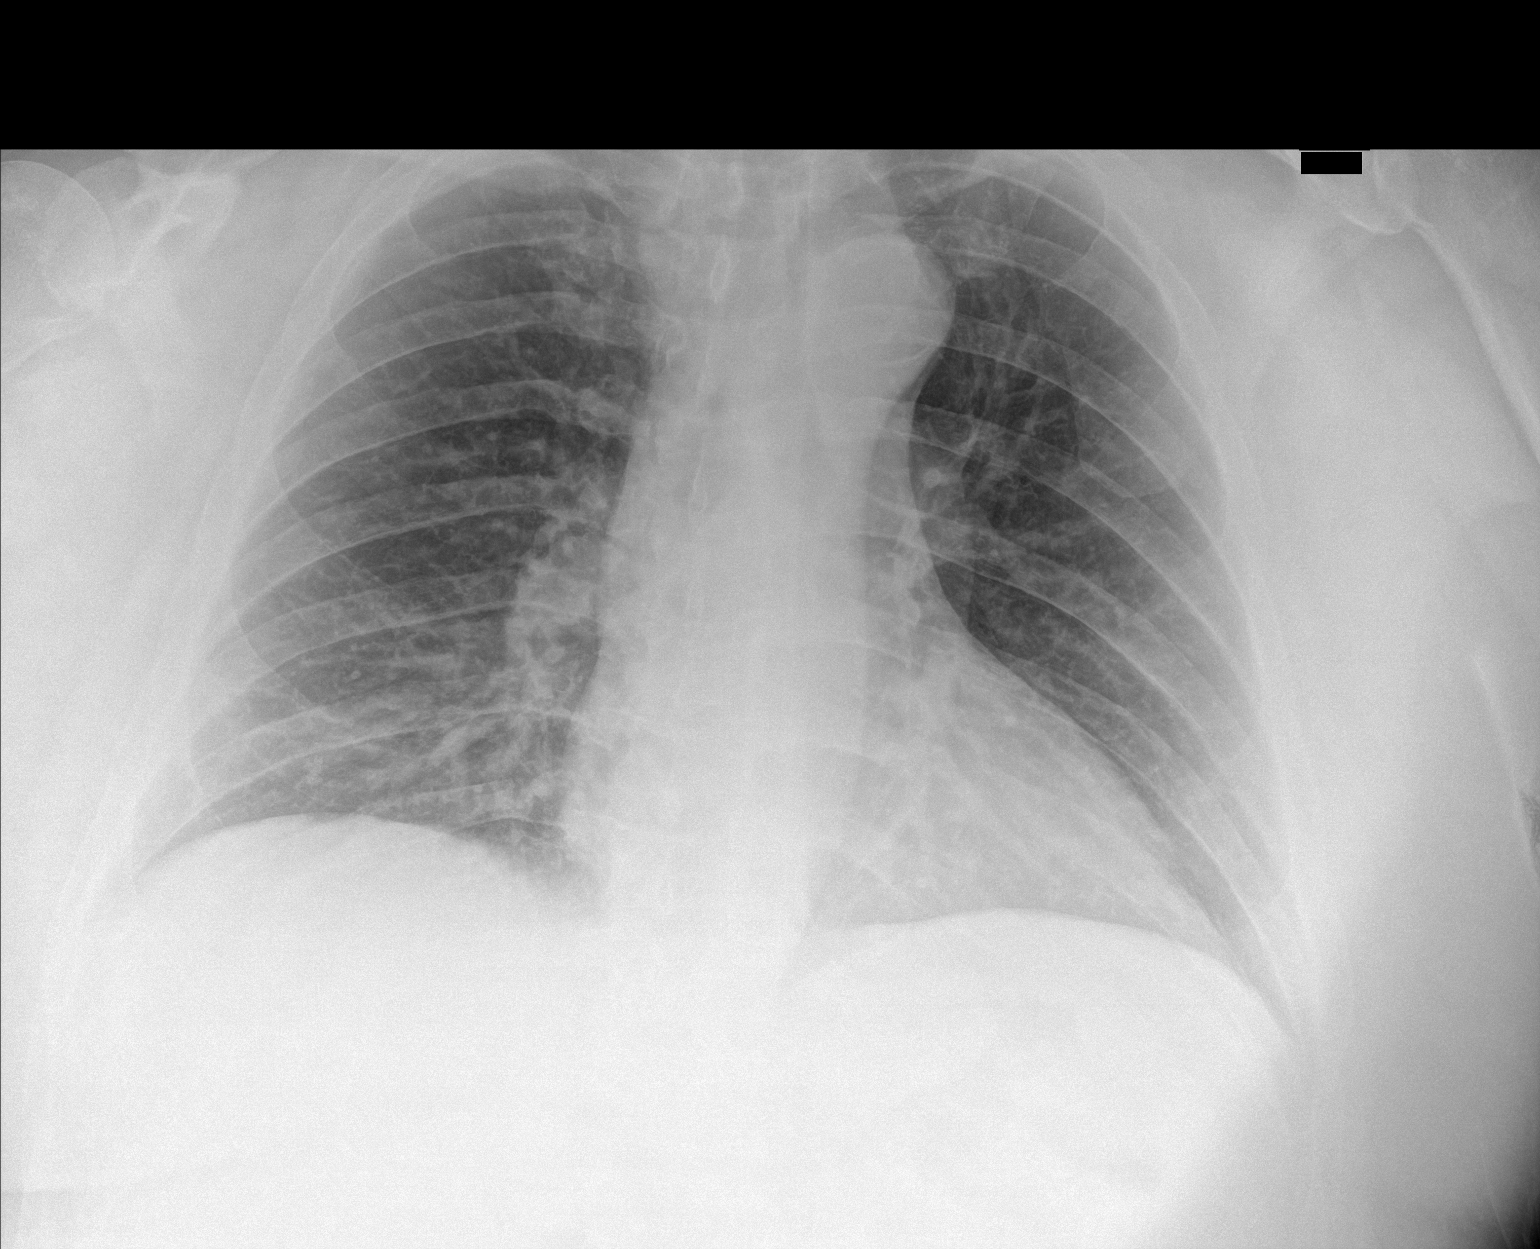

[1 of 1 positions shown; findings below may reference images not displayed]

FINDINGS: Normal heart size, mediastinal contours, and pulmonary vascularity.

Atherosclerotic calcification aorta.

Subsegmental atelectasis RIGHT base.

Lungs otherwise clear.

No infiltrate, pleural effusion or pneumothorax.
IMPRESSION: Subsegmental atelectasis RIGHT lung base.

## 2020-07-08 IMAGING — DX PORTABLE CHEST - 1 VIEW
1 series · 1 of 1 positions shown · non-contrast
Comparison: 07/27/2018

CLINICAL DATA: Increasing shortness of breath and positive Vovid-GX
diagnosis

EXAM:
PORTABLE CHEST 1 VIEW

[chest ap]
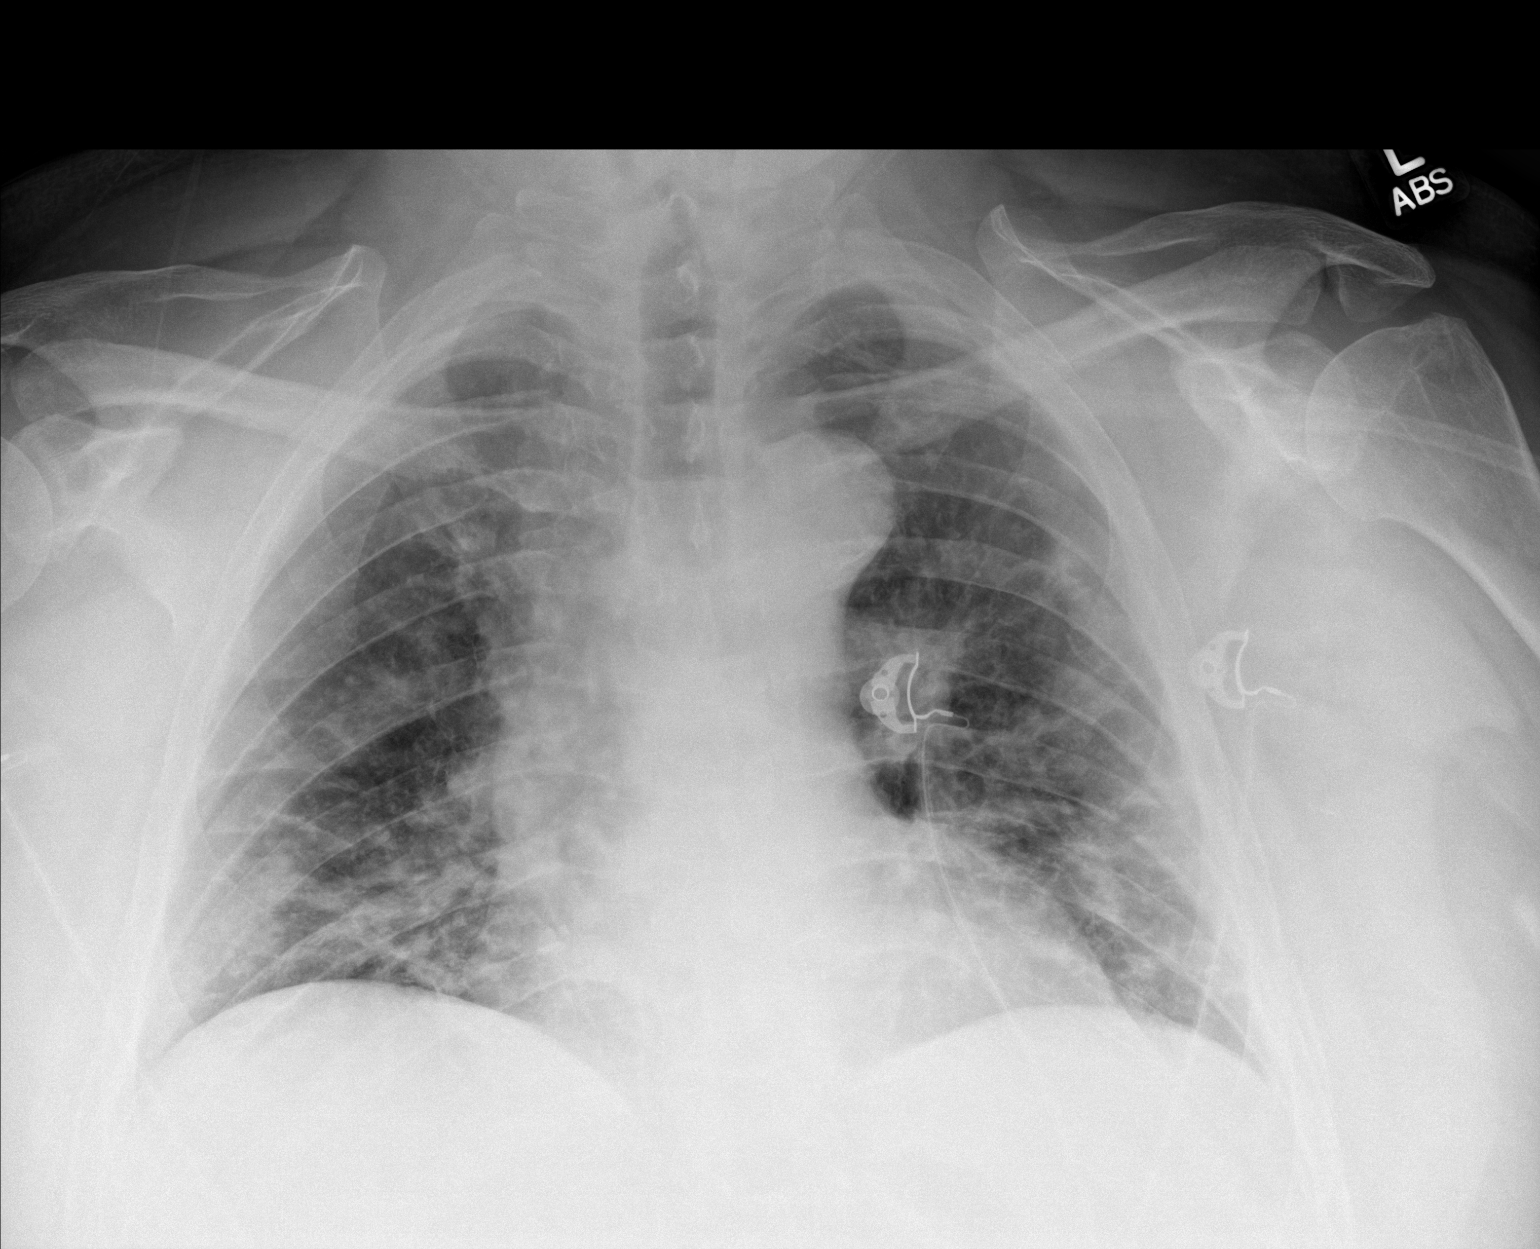

[1 of 1 positions shown; findings below may reference images not displayed]

FINDINGS: Cardiac shadow is enlarged but stable. Increasing bibasilar
infiltrates are noted consistent with the patient's given clinical
history. No sizable effusion is seen. No bony abnormality is noted.
IMPRESSION: Increasing bibasilar infiltrates.

## 2020-07-11 DIAGNOSIS — N528 Other male erectile dysfunction: Secondary | ICD-10-CM | POA: Insufficient documentation
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
## 2020-07-24 ENCOUNTER — Ambulatory Visit (INDEPENDENT_AMBULATORY_CARE_PROVIDER_SITE_OTHER): Payer: BC Managed Care – PPO | Admitting: Vascular Surgery

## 2020-07-24 ENCOUNTER — Encounter (INDEPENDENT_AMBULATORY_CARE_PROVIDER_SITE_OTHER): Payer: Self-pay | Admitting: Vascular Surgery

## 2020-07-24 ENCOUNTER — Other Ambulatory Visit: Payer: Self-pay

## 2020-07-24 ENCOUNTER — Encounter (INDEPENDENT_AMBULATORY_CARE_PROVIDER_SITE_OTHER): Payer: 59 | Admitting: Vascular Surgery

## 2020-07-24 VITALS — BP 121/80 | HR 96 | Resp 16 | Ht 70.0 in | Wt 283.8 lb

## 2020-07-24 DIAGNOSIS — I10 Essential (primary) hypertension: Secondary | ICD-10-CM | POA: Diagnosis not present

## 2020-07-24 DIAGNOSIS — I7121 Aneurysm of the ascending aorta, without rupture: Secondary | ICD-10-CM

## 2020-07-24 DIAGNOSIS — I712 Thoracic aortic aneurysm, without rupture: Secondary | ICD-10-CM | POA: Diagnosis not present

## 2020-12-28 ENCOUNTER — Ambulatory Visit: Payer: BC Managed Care – PPO | Admitting: Dermatology

## 2020-12-28 ENCOUNTER — Other Ambulatory Visit: Payer: Self-pay

## 2020-12-28 DIAGNOSIS — L732 Hidradenitis suppurativa: Secondary | ICD-10-CM | POA: Diagnosis not present

## 2020-12-28 DIAGNOSIS — L0591 Pilonidal cyst without abscess: Secondary | ICD-10-CM

## 2020-12-28 MED ORDER — CLINDAMYCIN PHOSPHATE 1 % EX LOTN: TOPICAL_LOTION | Freq: Every day | CUTANEOUS | 2 refills | Status: DC

## 2020-12-28 MED ORDER — DOXYCYCLINE HYCLATE 20 MG PO TABS: 20.0000 mg | ORAL_TABLET | Freq: Two times a day (BID) | ORAL | 2 refills | Status: DC

## 2020-12-28 NOTE — Progress Notes (Signed)
   New Patient Visit  Subjective  Kenneth Briggs is a 55 y.o. male who presents for the following: tender painful nodules. Patient has suffered with condition for many years in the perianal area. Lesions will fester, become inflamed, then rupture. They can be very painful. He has a similar condition on the neck and another cystic nodule of the buttock crease that ruptured many years ago and is sometimes tender. He has never been prescribed long term medications for condition, but was prescribed an antibiotic in the past which did seem to help.   The following portions of the chart were reviewed this encounter and updated as appropriate:   Tobacco  Allergies  Meds  Problems  Med Hx  Surg Hx  Fam Hx      Review of Systems:  No other skin or systemic complaints except as noted in HPI or Assessment and Plan.  Objective  Well appearing patient in no apparent distress; mood and affect are within normal limits.  A focused examination was performed including the neck, buttocks, groin. Relevant physical exam findings are noted in the Assessment and Plan.  Neck and groin Neck with multiple cysts and scars. Scarring and cyst of the inguinal fold. Small sinus tract R inguinal fold.   Gluteal Crease Firm SQ nodule.   Assessment & Plan  Hidradenitis suppurativa Neck and groin  Chronic condition with duration or expected duration over one year. Condition is bothersome to patient. Currently flared.  Hidradenitis Suppurativa is a chronic; persistent; non-curable, but treatable condition due to abnormal inflamed sweat glands in the body folds (axilla, inframammary, groin, medial thighs), causing recurrent painful cysts and scarring. It can be associated with severe scarring acne and cysts; abscesses and scarring of scalp. The goal is control and prevention of flares, as it is not curable. Scars are permanent and can be thickened. Treatment may include daily use of topical medication and oral  antibiotics.  Oral isotretinoin may also be helpful.  For more severe cases, Humira (a biologic injection) may be prescribed to decrease the inflammatory process and prevent flares.  When indicated, inflamed cysts may also be treated surgically.  Start Doxycycline 20mg  BID. Doxycycline should be taken with food to prevent nausea. Do not lay down for 30 minutes after taking. Be cautious with sun exposure and use good sun protection while on this medication. Pregnant women should not take this medication.   Start Clindamycin lotion to aa's QD-BID. Use BP wash or Chlorhexidine wash daily in the shower. Avoid contact with eyes and ears.    doxycycline (PERIOSTAT) 20 MG tablet - Neck and groin Take 1 tablet (20 mg total) by mouth 2 (two) times daily. Take with food.  clindamycin (CLEOCIN-T) 1 % lotion - Neck and groin Apply topically daily.  Pilonidal cyst Gluteal Crease  Vs HS   Currently asymptomatic but with history of flares. Discussed referral to general surgeon (Dr. ) for excision. Patient declines referral at this time.  Return in about 3 months (around 03/29/2021) for HS follow up .  14/04/2020, CMA, am acting as scribe for Maylene Roes, MD .   Documentation: I have reviewed the above documentation for accuracy and completeness, and I agree with the above.  Darden Dates, MD

## 2020-12-28 NOTE — Patient Instructions (Addendum)
If you have any questions or concerns for your doctor, please call our main line at [phone] and press option 4 to reach your doctor's medical assistant. If no one answers, please leave a voicemail as directed and we will return your call as soon as possible. Messages left after 4 pm will be answered the following business day.   You may also send [place] a message via [place]. We typically respond to MyChart messages within 1-2 business days.  For prescription refills, please ask your pharmacy to contact our office. Our fax number is [phone].  If you have an urgent issue when the clinic is closed that cannot wait until the next business day, you can page your doctor at the number below.    Please note that while we do our best to be available for urgent issues outside of office hours, we are not available 24/7.   If you have an urgent issue and are unable to reach [place], you may choose to seek medical care at your doctor's office, retail clinic, urgent care center, or emergency room.  If you have a medical emergency, please immediately call 911 or go to the emergency department.  Pager Numbers  - Dr. Nehemiah Massed: [phone]  - Dr. Laurence Ferrari: [phone]  - Dr. Nicole Kindred: [phone]  In the event of inclement weather, please call our main line at [phone] for an update on the status of any delays or closures.  Dermatology Medication Tips: Please keep the boxes that topical medications come in in order to help keep track of the instructions about where and how to use these. Pharmacies typically print the medication instructions only on the boxes and not directly on the medication tubes.   If your medication is too expensive, please contact our office at [phone] option 4 or send [place] a message through [place].   We are unable to tell what your co-pay for medications will be in advance as this is different depending on your insurance coverage. However, we may be able to find a substitute  medication at lower cost or fill out paperwork to get insurance to cover a needed medication.   If a prior authorization is required to get your medication covered by your insurance company, please allow [place] 1-2 business days to complete this process.  Drug prices often vary depending on where the prescription is filled and some pharmacies may offer cheaper prices.  The website www.goodrx.com contains coupons for medications through different pharmacies. The prices here do not account for what the cost may be with help from insurance (it may be cheaper with your insurance), but the website can give you the price if you did not use any insurance.  - You can print the associated coupon and take it with your prescription to the pharmacy.  - You may also stop by our office during regular business hours and pick up a GoodRx coupon card.  - If you need your prescription sent electronically to a different pharmacy, notify our office through [facility] or by phone at [phone] option 4.  Recommend taking Heliocare sun protection supplement daily in sunny weather for additional sun protection. For maximum protection on the sunniest days, you can take up to 2 capsules of regular Heliocare OR take 1 capsule of Heliocare Ultra. For prolonged exposure (such as a full day in the sun), you can repeat your dose of the supplement 4 hours after your first dose. Heliocare can be purchased at [facility] or at [email or web address].  Use Benzoyl peroxide wash or Chlorhexidine wash daily in the shower. Avoid contact with eyes and ears.

## 2021-01-10 ENCOUNTER — Encounter: Payer: Self-pay | Admitting: Dermatology

## 2021-03-27 ENCOUNTER — Other Ambulatory Visit: Payer: Self-pay | Admitting: Dermatology

## 2021-03-27 DIAGNOSIS — L732 Hidradenitis suppurativa: Secondary | ICD-10-CM

## 2021-04-04 ENCOUNTER — Ambulatory Visit: Payer: BC Managed Care – PPO | Admitting: Dermatology

## 2021-04-04 ENCOUNTER — Other Ambulatory Visit: Payer: Self-pay

## 2021-04-04 ENCOUNTER — Encounter: Payer: Self-pay | Admitting: Dermatology

## 2021-04-04 DIAGNOSIS — L0591 Pilonidal cyst without abscess: Secondary | ICD-10-CM | POA: Diagnosis not present

## 2021-04-04 DIAGNOSIS — L732 Hidradenitis suppurativa: Secondary | ICD-10-CM | POA: Diagnosis not present

## 2021-04-04 MED ORDER — CLINDAMYCIN PHOSPHATE 1 % EX LOTN: TOPICAL_LOTION | Freq: Every day | CUTANEOUS | 5 refills | Status: AC

## 2021-04-04 MED ORDER — DOXYCYCLINE HYCLATE 20 MG PO TABS: 20.0000 mg | ORAL_TABLET | Freq: Two times a day (BID) | ORAL | 11 refills | Status: AC

## 2021-04-04 NOTE — Progress Notes (Signed)
   Follow-Up Visit   Subjective  Kenneth Briggs is a 55 y.o. male who presents for the following: Follow-up (Recheck HS/Pilonidal cyst. Neck, groin, superior gluteal cleft. States he has not had any flare ups since last visit. Taking Doxycycline 20mg  twice daily, using Clindamycin lotion. Has not been using BPO wash or chlorhexidine wash since last visit. ).  The following portions of the chart were reviewed this encounter and updated as appropriate:  Tobacco  Allergies  Meds  Problems  Med Hx  Surg Hx  Fam Hx      Review of Systems: No other skin or systemic complaints except as noted in HPI or Assessment and Plan.   Objective  Well appearing patient in no apparent distress; mood and affect are within normal limits.  A focused examination was performed including neck, face, groin, buttocks. Relevant physical exam findings are noted in the Assessment and Plan.  Gluteal Crease Red papule at right gluteal crease  Neck and Groin Red papule at right gluteal crease  Assessment & Plan  Pilonidal cyst Gluteal Crease  Favored over HS cyst Doing well Discussed excision as curative therapy. Pt defers.  Continue Doxycycline and Clindamycin as directed.   Hidradenitis suppurativa Neck and Groin  Chronic condition with duration or expected duration over one year. Currently well-controlled.  Continue Clindamycin lotion qd-bid as needed for flares.  Continue Doxycycline 20mg  twice daily with food. Can decrease to daily to see if stays well-controlled, increase to twice daily as needed for flares.  Doxycycline should be taken with food to prevent nausea. Do not lay down for 30 minutes after taking. Be cautious with sun exposure and use good sun protection while on this medication. Pregnant women should not take this medication.    Related Medications doxycycline (PERIOSTAT) 20 MG tablet Take 1 tablet (20 mg total) by mouth 2 (two) times daily. Take with food.  clindamycin  (CLEOCIN-T) 1 % lotion Apply topically daily.  Return for HS recheck in 1 year. , CMA, am acting as scribe for , MD.  Documentation: I have reviewed the above documentation for accuracy and completeness, and I agree with the above.  Jacquiline Doe, MD

## 2021-04-04 NOTE — Patient Instructions (Addendum)
Continue Clindamycin lotion qd-bid as needed for flares.  Continue Doxycycline 20mg  twice daily with food. Can decrease to daily, increase to twice daily as needed for flares.  Recommend using Chlorhexidine wash as directed in shower.   Doxycycline should be taken with food to prevent nausea. Do not lay down for 30 minutes after taking. Be cautious with sun exposure and use good sun protection while on this medication. Pregnant women should not take this medication.   Recommend taking Heliocare sun protection supplement daily in sunny weather for additional sun protection. For maximum protection on the sunniest days, you can take up to 2 capsules of regular Heliocare OR take 1 capsule of Heliocare Ultra. For prolonged exposure (such as a full day in the sun), you can repeat your dose of the supplement 4 hours after your first dose. Heliocare can be purchased at [facility] or at [facility].   If You Need Anything After Your Visit  If you have any questions or concerns for your doctor, please call our main line at [phone] and press option 4 to reach your doctor's medical assistant. If no one answers, please leave a voicemail as directed and we will return your call as soon as possible. Messages left after 4 pm will be answered the following business day.   You may also send [phone] a message via MyChart. We typically respond to MyChart messages within 1-2 business days.  For prescription refills, please ask your pharmacy to contact our office. Our fax number is [phone].  If you have an urgent issue when the clinic is closed that cannot wait until the next business day, you can page your doctor at the number below.    Please note that while we do our best to be available for urgent issues outside of office hours, we are not available 24/7.   If you have an urgent issue and are unable to reach [phone], you may choose to seek medical care at your doctor's office, retail clinic,  urgent care center, or emergency room.  If you have a medical emergency, please immediately call 911 or go to the emergency department.  Pager Numbers  - Dr. [place]: [phone]  - Dr. [phone]: [phone]  - Dr. [phone]: [phone]  In the event of inclement weather, please call our main line at [phone] for an update on the status of any delays or closures.  Dermatology Medication Tips: Please keep the boxes that topical medications come in in order to help keep track of the instructions about where and how to use these. Pharmacies typically print the medication instructions only on the boxes and not directly on the medication tubes.   If your medication is too expensive, please contact our office at [phone] option 4 or send [phone] a message through MyChart.   We are unable to tell what your co-pay for medications will be in advance as this is different depending on your insurance coverage. However, we may be able to find a substitute medication at lower cost or fill out paperwork to get insurance to cover a needed medication.   If a prior authorization is required to get your medication covered by your insurance company, please allow [place] 1-2 business days to complete this process.  Drug prices often vary depending on where the prescription is filled and some pharmacies may offer cheaper prices.  The website www.goodrx.com contains coupons for medications through different pharmacies. The prices here do not account for what the cost may be with help  from insurance (it may be cheaper with your insurance), but the website can give you the price if you did not use any insurance.  - You can print the associated coupon and take it with your prescription to the pharmacy.  - You may also stop by our office during regular business hours and pick up a GoodRx coupon card.  - If you need your prescription sent electronically to a different pharmacy, notify our office through [facility] or by phone at [phone] option 4.

## 2021-04-11 ENCOUNTER — Encounter: Payer: Self-pay | Admitting: Dermatology

## 2021-06-22 ENCOUNTER — Telehealth: Payer: Self-pay | Admitting: Podiatry

## 2021-06-22 NOTE — Telephone Encounter (Signed)
FYI   Pts wife called stating pt had a split on his toe and has went out of town and came back and it has now blistered and red. I got them scheduled to see you on Tuesday as that is the next available for a new pt. I did advise pts wife that if it is blistered he should go to the ED or urgent care as we do not have an appt sooner. She said she knows him and knows his is not going to go to the ed or urgent care. I also asked pts wife to call Monday to see if we have had any cancellations and  I have also added to the waitlist.   I reiterated that I recommend him go to the ED or urgent care.

## 2021-06-26 ENCOUNTER — Ambulatory Visit: Payer: 59 | Admitting: Podiatry

## 2021-08-08 ENCOUNTER — Other Ambulatory Visit: Payer: Self-pay

## 2021-08-08 ENCOUNTER — Emergency Department (HOSPITAL_BASED_OUTPATIENT_CLINIC_OR_DEPARTMENT_OTHER): Payer: 59

## 2021-08-08 ENCOUNTER — Emergency Department (HOSPITAL_BASED_OUTPATIENT_CLINIC_OR_DEPARTMENT_OTHER)
Admission: EM | Admit: 2021-08-08 | Discharge: 2021-08-08 | Disposition: A | Discharge status: Home / Self Care | Payer: 59 | Attending: Emergency Medicine | Admitting: Emergency Medicine

## 2021-08-08 ENCOUNTER — Encounter (HOSPITAL_BASED_OUTPATIENT_CLINIC_OR_DEPARTMENT_OTHER): Payer: Self-pay | Admitting: Emergency Medicine

## 2021-08-08 DIAGNOSIS — R300 Dysuria: Secondary | ICD-10-CM | POA: Diagnosis present

## 2021-08-08 DIAGNOSIS — N200 Calculus of kidney: Secondary | ICD-10-CM | POA: Insufficient documentation

## 2021-08-08 LAB — CBC WITH DIFFERENTIAL/PLATELET
Abs Immature Granulocytes: 0.03 10*3/uL (ref 0.00–0.07)
Basophils Absolute: 0 10*3/uL (ref 0.0–0.1)
Basophils Relative: 0 %
Eosinophils Absolute: 0.1 10*3/uL (ref 0.0–0.5)
Eosinophils Relative: 1 %
HCT: 41.8 % (ref 39.0–52.0)
Hemoglobin: 13.7 g/dL (ref 13.0–17.0)
Immature Granulocytes: 0 %
Lymphocytes Relative: 17 %
Lymphs Abs: 1.7 10*3/uL (ref 0.7–4.0)
MCH: 28.7 pg (ref 26.0–34.0)
MCHC: 32.8 g/dL (ref 30.0–36.0)
MCV: 87.4 fL (ref 80.0–100.0)
Monocytes Absolute: 0.7 10*3/uL (ref 0.1–1.0)
Monocytes Relative: 7 %
Neutro Abs: 7.3 10*3/uL (ref 1.7–7.7)
Neutrophils Relative %: 75 %
Platelets: 315 10*3/uL (ref 150–400)
RBC: 4.78 MIL/uL (ref 4.22–5.81)
RDW: 13.4 % (ref 11.5–15.5)
WBC: 9.8 10*3/uL (ref 4.0–10.5)
nRBC: 0 % (ref 0.0–0.2)

## 2021-08-08 LAB — URINALYSIS, MICROSCOPIC (REFLEX)

## 2021-08-08 LAB — URINALYSIS, ROUTINE W REFLEX MICROSCOPIC
Bilirubin Urine: NEGATIVE
Glucose, UA: NEGATIVE mg/dL
Ketones, ur: NEGATIVE mg/dL
Leukocytes,Ua: NEGATIVE
Nitrite: NEGATIVE
Protein, ur: 300 mg/dL — AB
Specific Gravity, Urine: 1.03 (ref 1.005–1.030)
pH: 6 (ref 5.0–8.0)

## 2021-08-08 LAB — BASIC METABOLIC PANEL
Anion gap: 12 (ref 5–15)
BUN: 25 mg/dL — ABNORMAL HIGH (ref 6–20)
CO2: 25 mmol/L (ref 22–32)
Calcium: 9.2 mg/dL (ref 8.9–10.3)
Chloride: 103 mmol/L (ref 98–111)
Creatinine, Ser: 1.53 mg/dL — ABNORMAL HIGH (ref 0.61–1.24)
GFR, Estimated: 54 mL/min — ABNORMAL LOW (ref >60)
Glucose, Bld: 162 mg/dL — ABNORMAL HIGH (ref 70–99)
Potassium: 3.9 mmol/L (ref 3.5–5.1)
Sodium: 140 mmol/L (ref 135–145)

## 2021-08-08 MED ORDER — ONDANSETRON 4 MG PO TBDP: 4.0000 mg | ORAL_TABLET | Freq: Three times a day (TID) | ORAL | 0 refills | Status: AC | PRN

## 2021-08-08 MED ORDER — KETOROLAC TROMETHAMINE 15 MG/ML IJ SOLN
15.0000 mg | Freq: Once | INTRAMUSCULAR | Status: AC
  Administered 2021-08-08: 15 mg via INTRAVENOUS
  Filled 2021-08-08: qty 1

## 2021-08-08 MED ORDER — TAMSULOSIN HCL 0.4 MG PO CAPS: 0.4000 mg | ORAL_CAPSULE | Freq: Two times a day (BID) | ORAL | 0 refills | Status: AC

## 2021-08-08 MED ORDER — HYDROMORPHONE HCL 1 MG/ML IJ SOLN
1.0000 mg | Freq: Once | INTRAMUSCULAR | Status: AC
  Administered 2021-08-08: 1 mg via INTRAVENOUS
  Filled 2021-08-08: qty 1

## 2021-08-08 MED ORDER — HYDROCODONE-ACETAMINOPHEN 5-325 MG PO TABS
2.0000 | ORAL_TABLET | Freq: Once | ORAL | Status: AC
  Administered 2021-08-08: 2 via ORAL
  Filled 2021-08-08: qty 2

## 2021-08-08 MED ORDER — ONDANSETRON HCL 4 MG/2ML IJ SOLN
4.0000 mg | Freq: Once | INTRAMUSCULAR | Status: AC
  Administered 2021-08-08: 4 mg via INTRAVENOUS
  Filled 2021-08-08: qty 2

## 2021-08-08 MED ORDER — HYDROCODONE-ACETAMINOPHEN 5-325 MG PO TABS: 1.0000 | ORAL_TABLET | Freq: Four times a day (QID) | ORAL | 0 refills | Status: AC | PRN

## 2021-08-08 MED ORDER — IPRATROPIUM BROMIDE 0.03 % NA SOLN: 2.0000 | Freq: Two times a day (BID) | NASAL | 0 refills | Status: DC

## 2021-08-08 MED ORDER — LACTATED RINGERS IV BOLUS
1000.0000 mL | Freq: Once | INTRAVENOUS | Status: AC
  Administered 2021-08-08: 1000 mL via INTRAVENOUS

## 2021-08-08 NOTE — ED Notes (Signed)
Pt verbalizes understanding of discharge instructions. Opportunity for questioning and answers were provided. Pt discharged from ED to home with wife.    

## 2021-08-08 NOTE — ED Provider Notes (Signed)
?[facility] ?[facility] Emergency Department ?Provider Note ?MRN:  [id number]  ?Arrival date & time: 08/08/21    ? ?Chief Complaint   ?Back Pain and Dysuria ?  ?History of Present Illness   ?Curtis Lamb is a 56 y.o. year-old male presents to the ED with chief complaint of right sided flank/back pain that awakened him from sleep at midnight.  He rates the pain as severe.  He states that he has associated dysuria.  Denies hx of KS.  Denies fever or vomiting.  Denies successful treatment PTA. ? ?History provided by patient. ? ? ?Review of Systems  ?Pertinent review of systems noted in HPI.  ? ? ?Physical Exam  ? ?Vitals:  ? 08/08/21 0333 08/08/21 0511  ?BP: (!) 171/80 (!) 155/81  ?Pulse: 86 82  ?Resp: (!) 24 20  ?Temp: 97.8 ?F (36.6 ?C)   ?SpO2: 97% 92%  ?  ?CONSTITUTIONAL:  uncomfortable-appearing, NAD ?NEURO:  Alert and oriented x 3, CN 3-12 grossly intact ?EYES:  eyes equal and reactive ?ENT/NECK:  Supple, no stridor  ?CARDIO:  Appears well-perfused  ?PULM:  No respiratory distress,  ?GI/GU:  non-distended,  ?MSK/SPINE:  No gross deformities, no edema, moves all extremities  ?SKIN:  no rash, atraumatic ? ? ?*Additional and/or pertinent findings included in MDM below ? ?Diagnostic and Interventional Summary  ? ? EKG Interpretation ? ?Date/Time:    ?Ventricular Rate:    ?PR Interval:    ?QRS Duration:   ?QT Interval:    ?QTC Calculation:   ?R Axis:     ?Text Interpretation:   ?  ? ?  ? ?Labs Reviewed  ?URINALYSIS, ROUTINE W REFLEX MICROSCOPIC - Abnormal; Notable for the following components:  ?    Result Value  ? Color, Urine YELLOW (*)   ? Hgb urine dipstick 3+ (*)   ? Protein, ur 300 (*)   ? All other components within normal limits  ?BASIC METABOLIC PANEL - Abnormal; Notable for the following components:  ? Glucose, Bld 162 (*)   ? BUN 25 (*)   ? Creatinine, Ser 1.53 (*)   ? GFR, Estimated 54 (*)   ? All other components within normal limits  ?URINALYSIS, MICROSCOPIC (REFLEX) - Abnormal; Notable  for the following components:  ? Bacteria, UA FEW (*)   ? All other components within normal limits  ?CBC WITH DIFFERENTIAL/PLATELET  ?  ?CT Renal Stone Study  ?Final Result  ?  ?  ?Medications  ?HYDROmorphone (DILAUDID) injection 1 mg (1 mg Intravenous Given 08/08/21 0352)  ?ondansetron [facility]) injection 4 mg (4 mg Intravenous Given 08/08/21 0352)  ?lactated ringers bolus 1,000 mL (0 mLs Intravenous Stopped 08/08/21 0541)  ?ketorolac (TORADOL) 15 MG/ML injection 15 mg (15 mg Intravenous Given 08/08/21 0508)  ?HYDROcodone-acetaminophen (NORCO/VICODIN) 5-325 MG per tablet 2 tablet (2 tablets Oral Given 08/08/21 0510)  ?  ? ?Procedures  /  Critical Care ?Procedures ? ?ED Course and Medical Decision Making  ?I have reviewed the triage vital signs, the nursing notes, and pertinent available records from the EMR. ? ?Complexity of Problems Addressed: ?High Complexity: Acute illness/injury posing a threat to life or bodily function, requiring emergent diagnostic workup, evaluation, and treatment as below. ?Comorbidities affecting this illness/injury include: ?None ?Social Determinants Affecting Care: ? No clinically significant social determinants affecting this chief complaint.. ? ? ?ED Course: ?After considering the following differential, kidney stone, UTI, pyelo, I ordered labs and CT. ? ?I ordered IV dilaudid to treat pain. ? ?I personally interpreted  the labs which are notable for mildly elevated creatinine ?I visualized the CT, which is notable for KS and agree with radiologist interpretation.. ? ?Clinical Course as of 08/08/21 0558  ?Wed Aug 08, 2021  ?0558 Pain significantly improved after toradol.   [RB]  ?  ?Clinical Course User Index ?[RB] Roxy Horseman, PA-C  ? ? ?Consultants: ?No consultations were needed in caring for this patient. ? ?Treatment and Plan: ?Patient here with right-sided flank pain/back pain.  Patient appears uncomfortable, and symptoms and history seem consistent with kidney stone. ? ?Emergency  department workup does not suggest an emergent condition requiring admission or immediate intervention beyond  what has been performed at this time. The patient is safe for discharge and has  been instructed to return immediately for worsening symptoms, change in  symptoms or any other concerns ? ?Patient discussed with attending physician, Dr. Read Drivers, who agrees with plan for close urology follow-up. ? ?Final Clinical Impressions(s) / ED Diagnoses  ? ?  ICD-10-CM   ?1. Kidney stone  N20.0   ?  ?  ?ED Discharge Orders   ? ?      Ordered  ?  ipratropium (ATROVENT) 0.03 % nasal spray  Every 12 hours,   Status:  Discontinued       ? 08/08/21 0412  ?  ondansetron (ZOFRAN-ODT) 4 MG disintegrating tablet  Every 8 hours PRN       ? 08/08/21 0412  ?  HYDROcodone-acetaminophen (NORCO/VICODIN) 5-325 MG tablet  Every 6 hours PRN       ? 08/08/21 0541  ?  ondansetron (ZOFRAN-ODT) 4 MG disintegrating tablet  Every 8 hours PRN       ? 08/08/21 0541  ?  tamsulosin (FLOMAX) 0.4 MG CAPS capsule  2 times daily       ? 08/08/21 0541  ? ?  ?  ? ?  ?  ? ? ?Discharge Instructions Discussed with and Provided to Patient:  ? ?Discharge Instructions   ?None ?  ? ?  ?Roxy Horseman, PA-C ?08/08/21 4098 ? ?  ?Paula Libra, MD ?08/08/21 [phone] ? ?

## 2021-08-08 NOTE — ED Triage Notes (Signed)
Presents from home for low back pain and pain with urination since 12a.  ?Able to void small volumes, nauseated.  ?No h/o kidney stones.  ?Denies abd pain, blood in urine, emesis.  ?Has not tried any medication for pain.  ? ?

## 2022-02-25 ENCOUNTER — Encounter (INDEPENDENT_AMBULATORY_CARE_PROVIDER_SITE_OTHER): Payer: Self-pay

## 2022-04-11 ENCOUNTER — Encounter: Payer: Self-pay | Admitting: Dermatology

## 2022-04-20 ENCOUNTER — Other Ambulatory Visit: Payer: Self-pay | Admitting: Dermatology

## 2022-04-20 DIAGNOSIS — L732 Hidradenitis suppurativa: Secondary | ICD-10-CM

## 2022-05-14 ENCOUNTER — Encounter: Payer: Self-pay | Admitting: Dermatology

## 2022-05-14 ENCOUNTER — Ambulatory Visit: Payer: BC Managed Care – PPO | Admitting: Dermatology

## 2022-05-14 ENCOUNTER — Other Ambulatory Visit: Payer: Self-pay

## 2022-05-14 VITALS — BP 137/81 | HR 94

## 2022-05-14 DIAGNOSIS — L732 Hidradenitis suppurativa: Secondary | ICD-10-CM | POA: Diagnosis not present

## 2022-05-14 MED ORDER — CLINDAMYCIN PHOSPHATE 1 % EX LOTN: TOPICAL_LOTION | CUTANEOUS | 11 refills | Status: DC

## 2022-05-14 MED ORDER — DOXYCYCLINE HYCLATE 20 MG PO TABS: 20.0000 mg | ORAL_TABLET | Freq: Two times a day (BID) | ORAL | 11 refills | Status: DC

## 2022-05-14 MED ORDER — RESORCINOL POWD: 2 refills | Status: AC

## 2022-05-14 NOTE — Progress Notes (Signed)
Resorcinol 15% cream apply twice daily to affected areas as needed for flares.

## 2022-05-14 NOTE — Progress Notes (Signed)
   Follow-Up Visit   Subjective  Kenneth Briggs is a 57 y.o. male who presents for the following: Follow-up (Here for yearly examination for HS. Needs refills of Doxycycline 20 mg and Clindamycin lotion. Controls condition well. Flares every 3-4 months, lasts 2-3 days then improves ).    The following portions of the chart were reviewed this encounter and updated as appropriate:  Tobacco  Allergies  Meds  Problems  Med Hx  Surg Hx  Fam Hx      Review of Systems: No other skin or systemic complaints except as noted in HPI or Assessment and Plan.   Objective  Well appearing patient in no apparent distress; mood and affect are within normal limits.  A focused examination was performed including neck. groin. Relevant physical exam findings are noted in the Assessment and Plan.  Neck, groin Scarring and cysts at neck. Deferred groin exam today.    Assessment & Plan  Hidradenitis suppurativa Neck, groin  Chronic and persistent condition with duration or expected duration over one year. Condition is symptomatic/ bothersome to patient. Not currently at goal.  Overall much better but getting flares every 3-4 months.  Continue Doxycycline 20 mg twice daily as needed. Take with food.  Continue Clindamycin lotion once or twice daily.   Amzeeq foam sample given today to apply once daily to affected areas for flares. Thin film only. Risk of orange coloration on clothing.  Call if would like Rx.  Doxycycline should be taken with food to prevent nausea. Do not lay down for 30 minutes after taking. Be cautious with sun exposure and use good sun protection while on this medication. Pregnant women should not take this medication.    Defers increasing to Doxycycline 100 mg for flares at this time.   Start Resorcinol 15% cream twice daily as needed for flares (compounded at [facility] in [place])  Can use Benzoyl peroxide wash or Hibiclens (chlorhexidine) to wash affected areas daily  to try and prevent the occasional flares.  doxycycline (PERIOSTAT) 20 MG tablet - Neck, groin Take 1 tablet (20 mg total) by mouth 2 (two) times daily. Take with food  clindamycin (CLEOCIN-T) 1 % lotion - Neck, groin Apply once or twice daily to affected areas   Return in about 1 year (around 05/15/2023) for HS Recheck.  I, Emelia Salisbury, CMA, am acting as scribe for Forest Gleason, MD.  Documentation: I have reviewed the above documentation for accuracy and completeness, and I agree with the above.  Forest Gleason, MD

## 2022-05-14 NOTE — Patient Instructions (Addendum)
Continue Doxycycline 20 mg twice daily as needed. Take with food.  Continue Clindamycin lotion once or twice daily.   Start Resorcinol cream twice daily as needed for flares. Sent to [facility] in [place].   Amzeeq foam sample given today to apply once daily to affected areas. Thin film only. Risk of orange coloration on clothing.  Call if would like Rx.  Can use Benzoyl peroxide wash or Hibiclens (chlorhexidine) to wash affected areas daily.   Doxycycline should be taken with food to prevent nausea. Do not lay down for 30 minutes after taking. Be cautious with sun exposure and use good sun protection while on this medication. Pregnant women should not take this medication.     Recommend taking Heliocare sun protection supplement daily in sunny weather for additional sun protection. For maximum protection on the sunniest days, you can take up to 2 capsules of regular Heliocare OR take 1 capsule of Heliocare Ultra. For prolonged exposure (such as a full day in the sun), you can repeat your dose of the supplement 4 hours after your first dose. Heliocare can be purchased at [employer], at some [facility] or at [email or web address].     Due to recent changes in healthcare laws, you may see results of your pathology and/or laboratory studies on MyChart before the doctors have had a chance to review them. We understand that in some cases there may be results that are confusing or concerning to you. Please understand that not all results are received at the same time and often the doctors may need to interpret multiple results in order to provide you with the best plan of care or course of treatment. Therefore, we ask that you please give [place] 2 business days to thoroughly review all your results before contacting the office for clarification. Should we see a critical lab result, you will be contacted sooner.   If You Need Anything After Your Visit  If you have any questions or concerns for  your doctor, please call our main line at [phone] and press option 4 to reach your doctor's medical assistant. If no one answers, please leave a voicemail as directed and we will return your call as soon as possible. Messages left after 4 pm will be answered the following business day.   You may also send [place] a message via MyChart. We typically respond to MyChart messages within 1-2 business days.  For prescription refills, please ask your pharmacy to contact our office. Our fax number is [phone].  If you have an urgent issue when the clinic is closed that cannot wait until the next business day, you can page your doctor at the number below.    Please note that while we do our best to be available for urgent issues outside of office hours, we are not available 24/7.   If you have an urgent issue and are unable to reach [place], you may choose to seek medical care at your doctor's office, retail clinic, urgent care center, or emergency room.  If you have a medical emergency, please immediately call 911 or go to the emergency department.  Pager Numbers  - Dr. Gwen Pounds: [phone]  - Dr. Neale Burly: [phone]  - Dr. Roseanne Reno: [phone]  In the event of inclement weather, please call our main line at [phone] for an update on the status of any delays or closures.  Dermatology Medication Tips: Please keep the boxes that topical medications come in in order to help keep track of  the instructions about where and how to use these. Pharmacies typically print the medication instructions only on the boxes and not directly on the medication tubes.   If your medication is too expensive, please contact our office at [phone] option 4 or send [place] a message through [place].   We are unable to tell what your co-pay for medications will be in advance as this is different depending on your insurance coverage. However, we may be able to find a substitute medication at lower cost or fill out  paperwork to get insurance to cover a needed medication.   If a prior authorization is required to get your medication covered by your insurance company, please allow [place] 1-2 business days to complete this process.  Drug prices often vary depending on where the prescription is filled and some pharmacies may offer cheaper prices.  The website www.goodrx.com contains coupons for medications through different pharmacies. The prices here do not account for what the cost may be with help from insurance (it may be cheaper with your insurance), but the website can give you the price if you did not use any insurance.  - You can print the associated coupon and take it with your prescription to the pharmacy.  - You may also stop by our office during regular business hours and pick up a GoodRx coupon card.  - If you need your prescription sent electronically to a different pharmacy, notify our office through [facility] or by phone at [phone] option 4.     Si Usted Necesita Algo Despus de Su Visita  Tambin puede enviarnos un mensaje a travs de Pharmacist, community. Por lo general respondemos a los mensajes de MyChart en el transcurso de 1 a 2 das hbiles.  Para renovar recetas, por favor pida a su farmacia que se ponga en contacto con nuestra oficina. Harland Dingwall de fax es [place] [phone].  Si tiene un asunto urgente cuando la clnica est cerrada y que no puede esperar hasta el siguiente da hbil, puede llamar/localizar a su doctor(a) al nmero que aparece a continuacin.   Por favor, tenga en cuenta que aunque hacemos todo lo posible para estar disponibles para asuntos urgentes fuera del horario de [place], no estamos disponibles las 24 horas del da, los 7 das de la [place].   Si tiene un problema urgente y no puede comunicarse con nosotros, puede optar por buscar atencin mdica  en el consultorio de su doctor(a), en una clnica privada, en un centro de atencin urgente o en una sala de  emergencias.  Si tiene Engineering geologist, por favor llame inmediatamente al 911 o vaya a la sala de emergencias.  Nmeros de bper  - Dr. Nehemiah Massed: [phone]  - Dra. Moye: [phone]  - Dra. Nicole Kindred: [phone]  En caso de inclemencias del [place], por favor llame a Johnsie Kindred principal al [phone] para una actualizacin sobre el [place] de cualquier retraso o cierre.  Consejos para la medicacin en dermatologa: Por favor, guarde las cajas en las que vienen los medicamentos de uso tpico para ayudarle a seguir las instrucciones sobre dnde y cmo usarlos. Las farmacias generalmente imprimen las instrucciones del medicamento slo en las cajas y no directamente en los tubos del [place].   Si su medicamento es muy caro, por favor, pngase en contacto con Zigmund Daniel llamando al [phone] y presione la opcin 4 o envenos un mensaje a travs de Pharmacist, community.   No podemos decirle cul ser su copago por los medicamentos por adelantado  ya que esto es diferente dependiendo de la cobertura de su seguro. Sin embargo, es posible que podamos encontrar un medicamento sustituto a Electrical engineer un formulario para que el seguro cubra el medicamento que se considera necesario.   Si se requiere una autorizacin previa para que su compaa de seguros [place] su medicamento, por favor permtanos de 1 a 2 das hbiles para completar este proceso.  Los precios de los medicamentos varan con frecuencia dependiendo del Environmental consultant de dnde se surte la receta y alguna farmacias pueden ofrecer precios ms baratos.  El sitio web www.goodrx.com tiene cupones para medicamentos de Airline pilot. Los precios aqu no tienen en cuenta lo que podra costar con la ayuda del seguro (puede ser ms barato con su seguro), pero el sitio web puede darle el precio si no utiliz Research scientist (physical sciences).  - Puede imprimir el cupn correspondiente y llevarlo con su receta a la farmacia.  - Tambin puede pasar por  nuestra oficina durante el horario de atencin regular y Charity fundraiser una tarjeta de cupones de GoodRx.  - Si necesita que su receta se enve electrnicamente a una farmacia diferente, informe a nuestra oficina a travs de MyChart de [place] o por telfono llamando al [phone] y presione la opcin 4.

## 2022-05-15 ENCOUNTER — Telehealth: Payer: Self-pay

## 2022-05-15 NOTE — Telephone Encounter (Signed)
Spoke with [facility] regarding Resorcinol RX sent in yesterday. At this time they can not even order the 15% powder. They have put out a call to a supplier to see if this is something they even have access to.  They wanted to let you know something OTC is the Resinol but its only 2%.

## 2022-05-16 NOTE — Telephone Encounter (Signed)
My apologies. I wonder if we got it from [facility] in [place]. The other place that has it is [facility]. It looks like it was $45 for a bottle from [place] last year. Can we try sending there and let patients know if available?

## 2022-05-16 NOTE — Telephone Encounter (Signed)
Called [facility] this morning. The do not have anything on file for the other patient mentioned to copy RX and they have found out they are officially unable to order powder or have access to powder.

## 2022-05-20 NOTE — Telephone Encounter (Signed)
Spoke with patient and he is not interested in medication at this time. aw

## 2022-06-05 ENCOUNTER — Other Ambulatory Visit: Payer: Self-pay | Admitting: Dermatology

## 2022-06-05 DIAGNOSIS — L732 Hidradenitis suppurativa: Secondary | ICD-10-CM

## 2023-05-22 ENCOUNTER — Encounter: Payer: BC Managed Care – PPO | Admitting: Dermatology

## 2023-05-22 ENCOUNTER — Ambulatory Visit: Payer: 59 | Admitting: Dermatology

## 2023-05-22 ENCOUNTER — Encounter: Payer: Self-pay | Admitting: Dermatology

## 2023-05-22 DIAGNOSIS — L732 Hidradenitis suppurativa: Secondary | ICD-10-CM

## 2023-05-22 MED ORDER — CLINDAMYCIN PHOSPHATE 1 % EX LOTN: TOPICAL_LOTION | CUTANEOUS | 4 refills | Status: DC

## 2023-05-22 MED ORDER — DOXYCYCLINE HYCLATE 20 MG PO TABS: 20.0000 mg | ORAL_TABLET | Freq: Two times a day (BID) | ORAL | 4 refills | Status: DC

## 2023-05-22 NOTE — Progress Notes (Signed)
   Follow-Up Visit   Subjective  Kenneth Briggs is a 58 y.o. male who presents for the following: Hidradenitis groin, neck 1 yr f/u, Doxycycline 20mg  1 po bid, when has flares takes Doxycycline 20mg  2 po bid, Clindamycin lotion qd, washing with Hibiclens qd, pt has had ~3-4 flares past year The patient has spots, moles and lesions to be evaluated, some may be new or changing and the patient may have concern these could be cancer.   The following portions of the chart were reviewed this encounter and updated as appropriate: medications, allergies, medical history  Review of Systems:  No other skin or systemic complaints except as noted in HPI or Assessment and Plan.  Objective  Well appearing patient in no apparent distress; mood and affect are within normal limits.   A focused examination was performed of the following areas: face  Relevant exam findings are noted in the Assessment and Plan.    Assessment & Plan   HIDRADENITIS SUPPURATIVA Exam: clear today per patient  Chronic and persistent condition with duration or expected duration over one year. Condition is improving with treatment but not currently at goal.   Hidradenitis Suppurativa is a chronic; persistent; non-curable, but treatable condition due to abnormal inflamed sweat glands in the body folds (axilla, inframammary, groin, medial thighs), causing recurrent painful draining cysts and scarring. It can be associated with severe scarring acne and cysts; also abscesses and scarring of scalp. The goal is control and prevention of flares, as it is not curable. Scars are permanent and can be thickened. Treatment may include daily use of topical medication and oral antibiotics.  Oral isotretinoin may also be helpful.  For some cases, Humira or Cosentyx (biologic injections) may be prescribed to decrease the inflammatory process and prevent flares.  When indicated, inflamed cysts may also be treated surgically.  Treatment  Plan: Cont Doxycycline 20mg  1 po bid, when flared can take 2 po bid, take with food and drink Cont Clindamycin lotion qd Cont Hibiclens wash qd in shower   Discussed biologic medications for HS if not controlled on current regimen  Doxycycline should be taken with food to prevent nausea. Do not lay down for 30 minutes after taking. Be cautious with sun exposure and use good sun protection while on this medication. Pregnant women should not take this medication.   HIDRADENITIS SUPPURATIVA   Related Medications Resorcinol POWD Apply twice daily to affected areas as needed for flares doxycycline (PERIOSTAT) 20 MG tablet Take 1 tablet (20 mg total) by mouth 2 (two) times daily. Take with food clindamycin (CLEOCIN-T) 1 % lotion Apply once or twice daily to affected areas groin, neck  Return in about 1 year (around 05/21/2024) for HS f/u.  I, Ardis Rowan, RMA, am acting as scribe for Elie Goody, MD .   Documentation: I have reviewed the above documentation for accuracy and completeness, and I agree with the above.  Elie Goody, MD

## 2023-05-22 NOTE — Patient Instructions (Signed)

## 2023-09-28 IMAGING — CT CT RENAL STONE PROTOCOL
2 of 4 series · 16 of 46 positions shown, 18 images · non-contrast
Comparison: CT chest, abdomen and Pelvis 01/04/2020

CLINICAL DATA: 55-year-old male with flank pain.



[Series 2: stone full · axial · 0.95mm/px · z∈[-126,+294]mm · 13 of 92 slices shown, 15 images]
[im 4/92  soft-tissue]
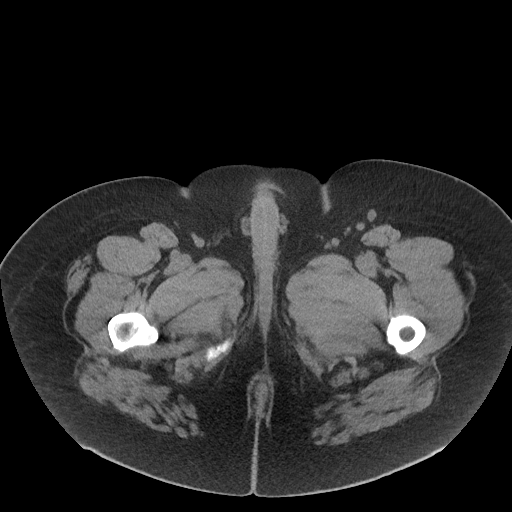
[im 4/92  bone]
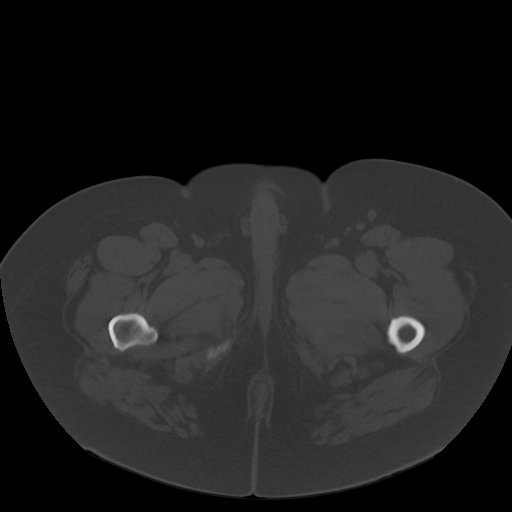
[im 12/92  soft-tissue]
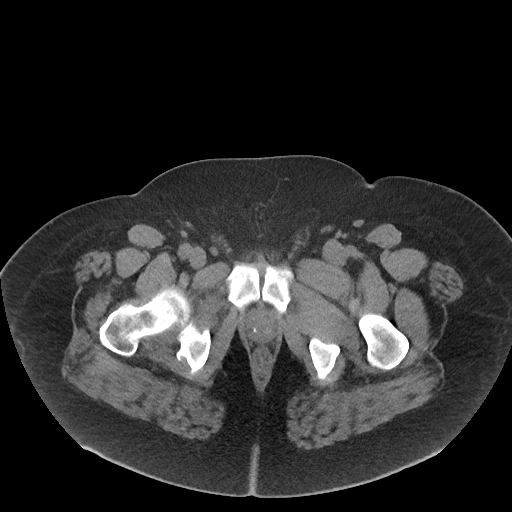
[im 19/92  soft-tissue]
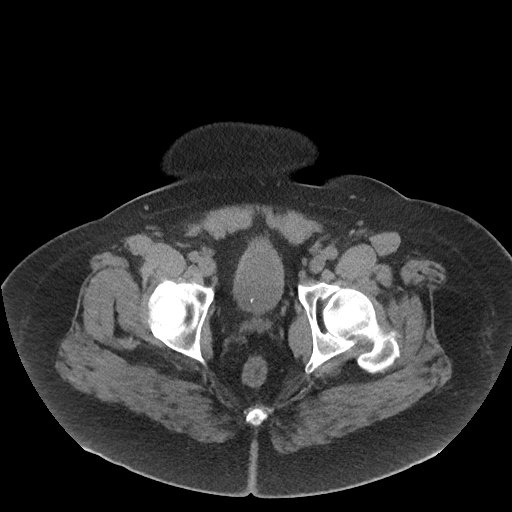
[im 27/92  soft-tissue]
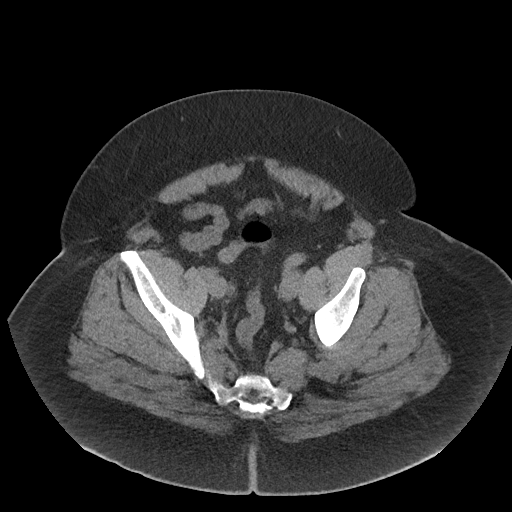
[im 31/92  soft-tissue]
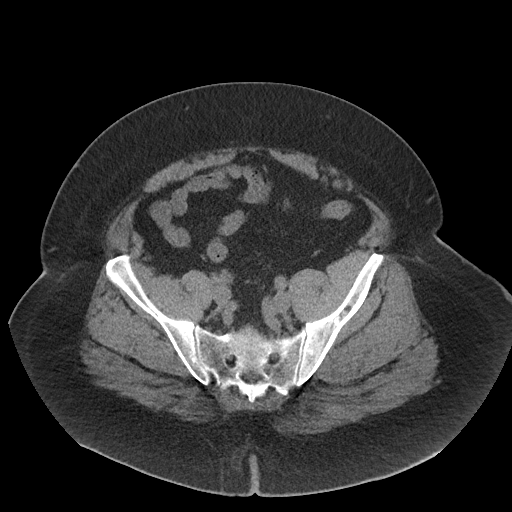
[im 38/92  soft-tissue]
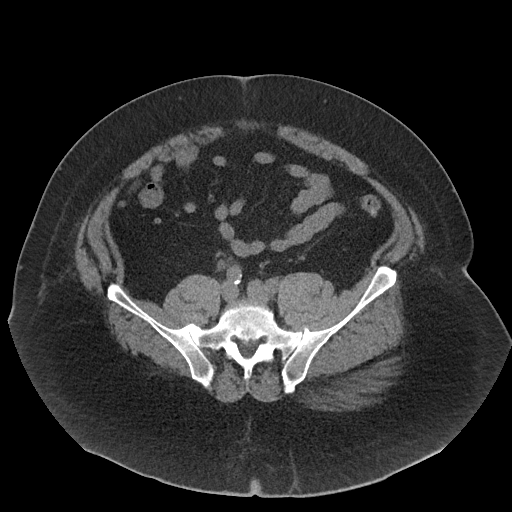
[im 46/92  soft-tissue]
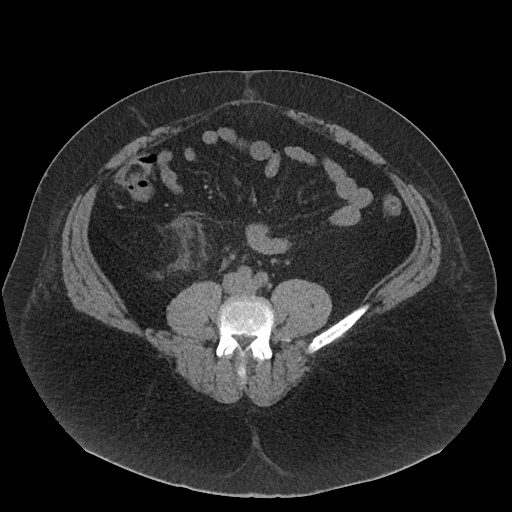
[im 54/92  soft-tissue]
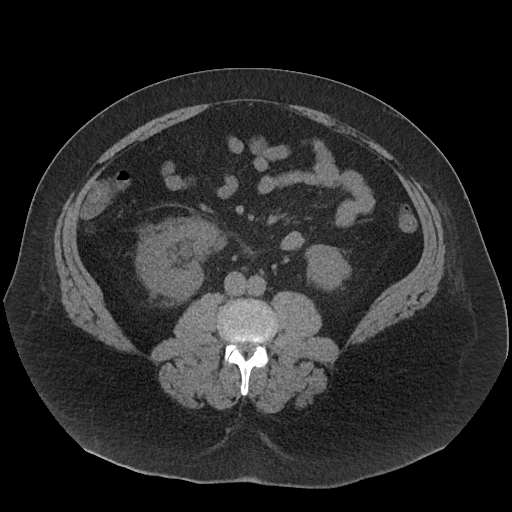
[im 61/92  soft-tissue]
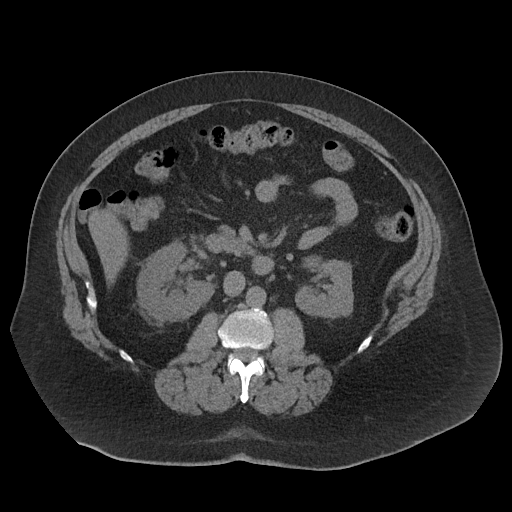
[im 61/92  bone]
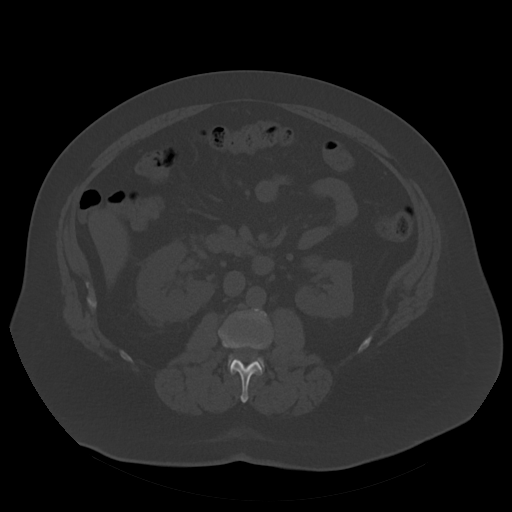
[im 65/92  soft-tissue]
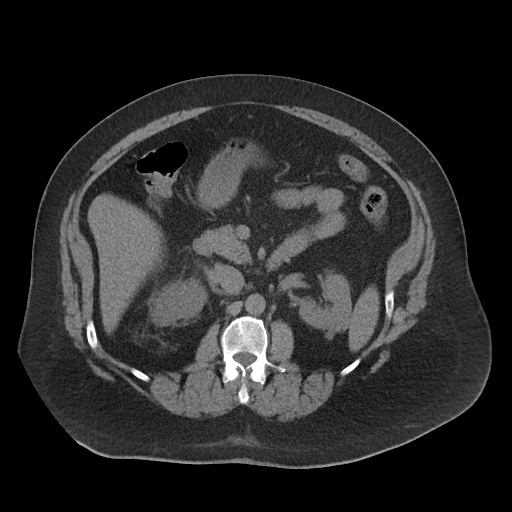
[im 73/92  soft-tissue]
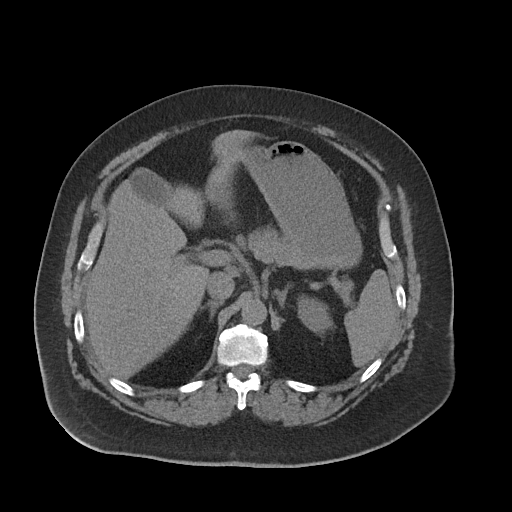
[im 80/92  soft-tissue]
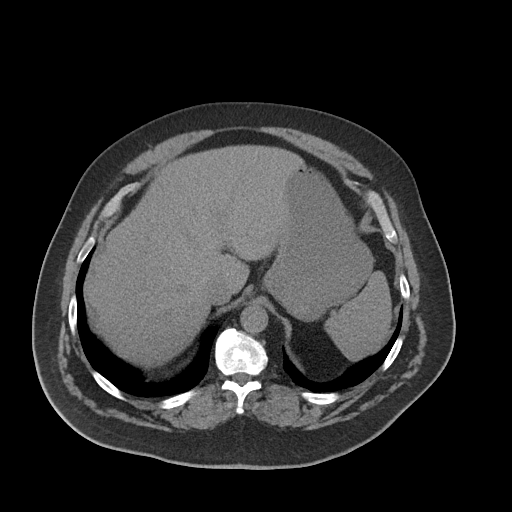
[im 88/92  soft-tissue]
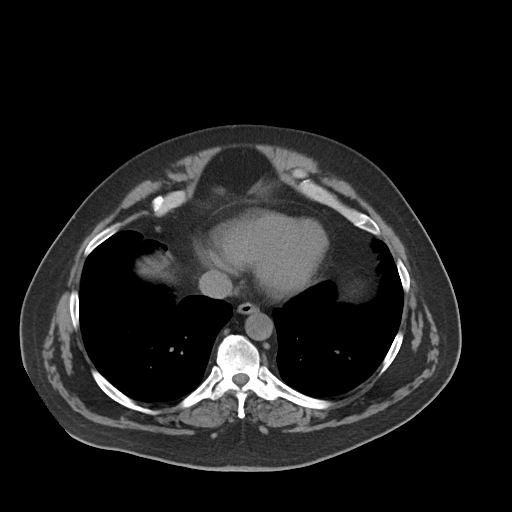

[Series 5: coronal · coronal · 0.93mm/px · 3 of 136 slices shown]
[im 46/136  soft-tissue]
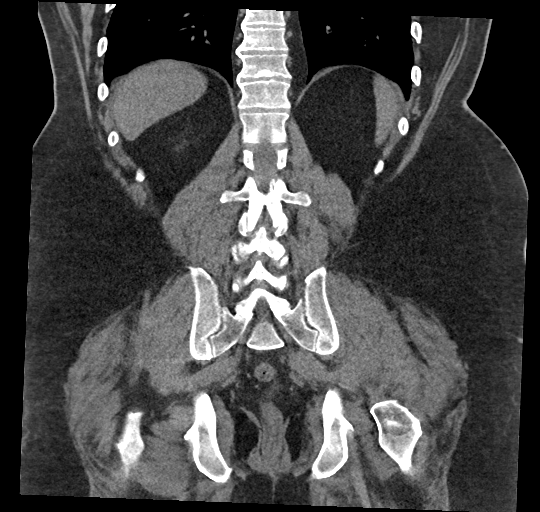
[im 61/136  soft-tissue]
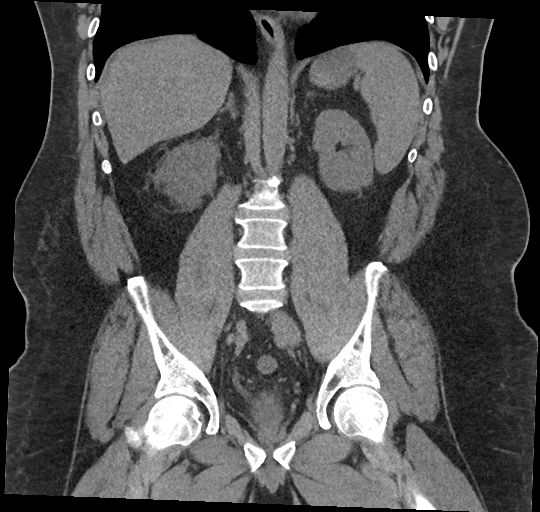
[im 76/136  soft-tissue]
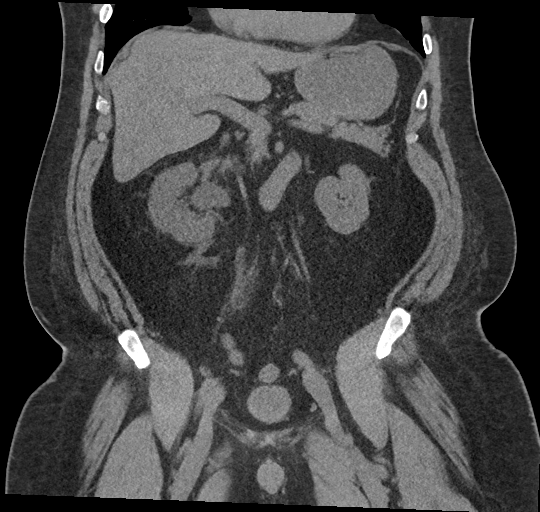

[16 of 46 positions shown; findings below may reference images not displayed]

FINDINGS: Lower chest: Negative, resolved small left pleural effusion seen in
2422.

Hepatobiliary: Negative noncontrast liver and gallbladder.

Pancreas: Negative.

Spleen: Negative.

Adrenals/Urinary Tract: Normal adrenal glands.

Normal noncontrast left kidney aside from 5-6 mm nonobstructing
lower pole calculus. Decompressed left ureter to the bladder.

Right nephromegaly, hydronephrosis and pararenal edema/inflammation.
Right hydroureter and periureteral inflammation continuing into the
pelvis. Tiny phlebolith in the right hemipelvis outside of the
ureter on series 2, image 72, but within the urinary bladder there
is an oblong calculus measuring 5 mm located just inside the UVJ.
Decompressed bladder. No other right side urinary calculus.

Stomach/Bowel: Negative aside from mild diverticulosis of the
descending colon and small fat containing umbilical hernia. No free
air or free fluid.

Vascular/Lymphatic: Normal caliber abdominal aorta. Mild Aortoiliac
calcified atherosclerosis. No lymphadenopathy identified.

Reproductive: Negative.

Other: No pelvic free fluid.

Musculoskeletal: Flowing endplate osteophytes in the lower thoracic
spine resulting in levels of interbody ankylosis. No acute osseous
abnormality identified.
IMPRESSION: 1. Acute obstructive uropathy on the right due to a 5 mm stone
inside the bladder near the right UVJ. Probable forniceal rupture.
2. Nonobstructing 5-6 mm left lower pole calculus.

## 2024-05-24 ENCOUNTER — Ambulatory Visit: Payer: 59 | Admitting: Dermatology

## 2024-05-27 ENCOUNTER — Encounter: Payer: Self-pay | Admitting: Dermatology

## 2024-05-27 ENCOUNTER — Ambulatory Visit: Admitting: Dermatology

## 2024-05-27 DIAGNOSIS — L821 Other seborrheic keratosis: Secondary | ICD-10-CM | POA: Diagnosis not present

## 2024-05-27 DIAGNOSIS — L732 Hidradenitis suppurativa: Secondary | ICD-10-CM

## 2024-05-27 DIAGNOSIS — L918 Other hypertrophic disorders of the skin: Secondary | ICD-10-CM

## 2024-05-27 DIAGNOSIS — D229 Melanocytic nevi, unspecified: Secondary | ICD-10-CM

## 2024-05-27 DIAGNOSIS — L814 Other melanin hyperpigmentation: Secondary | ICD-10-CM

## 2024-05-27 MED ORDER — DOXYCYCLINE HYCLATE 20 MG PO TABS: 20.0000 mg | ORAL_TABLET | Freq: Two times a day (BID) | ORAL | 4 refills | Status: AC

## 2024-05-27 MED ORDER — CLINDAMYCIN PHOSPHATE 1 % EX LOTN: TOPICAL_LOTION | CUTANEOUS | 4 refills | Status: AC

## 2024-05-27 NOTE — Progress Notes (Signed)
 "  Follow-Up Visit   Subjective  Kenneth Briggs is a 59 y.o. male who presents for the following: 1 year HS follow up. Patient taking doxycyline 20 mg bid, using clindamycin  and Hibiclens . Patient advises HS at groin and neck is controlled on current treatment.   Patient with a spot at right arm and one at right side he wants to make sure are ok.  The following portions of the chart were reviewed this encounter and updated as appropriate: medications, allergies, medical history  Review of Systems:  No other skin or systemic complaints except as noted in HPI or Assessment and Plan.  Objective  Well appearing patient in no apparent distress; mood and affect are within normal limits.   A focused examination was performed of the following areas: Neck R arm R flank  Relevant exam findings are noted in the Assessment and Plan.    Assessment & Plan   HIDRADENITIS SUPPURATIVA Exam: clear per patient  Chronic condition with duration or expected duration over one year. Currently well-controlled.   Hidradenitis Suppurativa is a chronic; persistent; non-curable, but treatable condition due to abnormal inflamed sweat glands in the body folds (axilla, inframammary, groin, medial thighs), causing recurrent painful draining cysts and scarring. It can be associated with severe scarring acne and cysts; also abscesses and scarring of scalp. The goal is control and prevention of flares, as it is not curable. Scars are permanent and can be thickened. Treatment may include daily use of topical medication and oral antibiotics.  Oral isotretinoin may also be helpful.  For some cases, Humira or Cosentyx (biologic injections) may be prescribed to decrease the inflammatory process and prevent flares.  When indicated, inflamed cysts may also be treated surgically.  Treatment Plan: Cont Doxycycline  20mg  1 po bid, when flared can take 2 po bid, take with food and drink Cont Clindamycin  lotion qd Cont  Hibiclens  wash qd in shower   Doxycycline  should be taken with food to prevent nausea. Do not lay down for 30 minutes after taking. Be cautious with sun exposure and use good sun protection while on this medication. Pregnant women should not take this medication.   SEBORRHEIC KERATOSIS - Stuck-on, waxy, tan-brown papules and/or plaques  - Benign-appearing - Discussed benign etiology and prognosis. - Observe - Call for any changes   LENTIGINES Exam: scattered tan macules Due to sun exposure Treatment Plan: Benign-appearing, observe. Recommend daily broad spectrum sunscreen SPF 30+ to sun-exposed areas, reapply every 2 hours as needed.  Call for any changes  MELANOCYTIC NEVI Exam: Tan-brown and/or pink-flesh-colored symmetric macules and papules  Treatment Plan: Benign appearing on exam today. Recommend observation. Call clinic for new or changing moles. Recommend daily use of broad spectrum spf 30+ sunscreen to sun-exposed areas.   Acrochordons (Skin Tags) - Fleshy, skin-colored pedunculated papules - Benign appearing.  - Observe. - If desired, they can be removed with an in office procedure that is not covered by insurance. - Please call the clinic if you notice any new or changing lesions.  HIDRADENITIS SUPPURATIVA   This Visit - doxycycline  (PERIOSTAT ) 20 MG tablet - Take 1 tablet (20 mg total) by mouth 2 (two) times daily. Take with food - clindamycin  (CLEOCIN -T) 1 % lotion - Apply once or twice daily to affected areas groin, neck Existing Treatments - Resorcinol POWD - Apply twice daily to affected areas as needed for flares SEBORRHEIC KERATOSES   LENTIGINES   MULTIPLE BENIGN NEVI   ACROCHORDON    Return in  about 1 year (around 05/27/2025) for with Dr. Claudene, Hidradenitis.  LILLETTE Lonell Drones, RMA, am acting as scribe for Boneta Claudene, MD .   Documentation: I have reviewed the above documentation for accuracy and completeness, and I agree with the  above.  Boneta Claudene, MD    "

## 2024-05-27 NOTE — Patient Instructions (Signed)

## 2025-05-31 ENCOUNTER — Ambulatory Visit: Admitting: Dermatology
